# Patient Record
Sex: Male | Born: 1965 | Race: Black or African American | Hispanic: No | Marital: Single | Smoking: Former smoker
Health system: Southern US, Community
[De-identification: ages and names within clinical notes are randomized; demographics above are authoritative.]

## PROBLEM LIST (undated history)

## (undated) DIAGNOSIS — N529 Male erectile dysfunction, unspecified: Secondary | ICD-10-CM

## (undated) DIAGNOSIS — E7800 Pure hypercholesterolemia, unspecified: Secondary | ICD-10-CM

## (undated) DIAGNOSIS — Z9889 Other specified postprocedural states: Secondary | ICD-10-CM

## (undated) DIAGNOSIS — Z923 Personal history of irradiation: Secondary | ICD-10-CM

---

## 2000-07-24 ENCOUNTER — Ambulatory Visit (HOSPITAL_BASED_OUTPATIENT_CLINIC_OR_DEPARTMENT_OTHER): Admission: RE | Admit: 2000-07-24 | Discharge: 2000-07-24 | Payer: Self-pay | Admitting: Orthopedic Surgery

## 2004-09-03 ENCOUNTER — Emergency Department (HOSPITAL_COMMUNITY): Admission: EM | Admit: 2004-09-03 | Discharge: 2004-09-04 | Payer: Self-pay | Admitting: Emergency Medicine

## 2004-09-10 ENCOUNTER — Emergency Department (HOSPITAL_COMMUNITY): Admission: EM | Admit: 2004-09-10 | Discharge: 2004-09-10 | Payer: Self-pay | Admitting: Emergency Medicine

## 2006-06-12 DIAGNOSIS — Z9889 Other specified postprocedural states: Secondary | ICD-10-CM

## 2006-06-12 HISTORY — DX: Other specified postprocedural states: Z98.890

## 2006-06-12 HISTORY — PX: OTHER SURGICAL HISTORY: SHX169

## 2008-07-26 ENCOUNTER — Ambulatory Visit (HOSPITAL_COMMUNITY): Admission: EM | Admit: 2008-07-26 | Discharge: 2008-07-26 | Payer: Self-pay | Admitting: Emergency Medicine

## 2008-07-26 ENCOUNTER — Encounter: Payer: Self-pay | Admitting: Emergency Medicine

## 2008-09-04 ENCOUNTER — Ambulatory Visit (HOSPITAL_COMMUNITY): Admission: RE | Admit: 2008-09-04 | Discharge: 2008-09-04 | Payer: Self-pay | Admitting: Plastic Surgery

## 2010-01-09 ENCOUNTER — Emergency Department (HOSPITAL_COMMUNITY): Admission: EM | Admit: 2010-01-09 | Discharge: 2010-01-09 | Payer: Self-pay | Admitting: Emergency Medicine

## 2010-01-13 ENCOUNTER — Emergency Department (HOSPITAL_COMMUNITY): Admission: EM | Admit: 2010-01-13 | Discharge: 2010-01-13 | Payer: Self-pay | Admitting: Emergency Medicine

## 2010-09-27 LAB — BASIC METABOLIC PANEL
Chloride: 105 mEq/L (ref 96–112)
Creatinine, Ser: 0.64 mg/dL (ref 0.4–1.5)
Glucose, Bld: 116 mg/dL — ABNORMAL HIGH (ref 70–99)
Potassium: 3.9 mEq/L (ref 3.5–5.1)
Sodium: 138 mEq/L (ref 135–145)

## 2010-09-27 LAB — DIFFERENTIAL
Basophils Absolute: 0 10*3/uL (ref 0.0–0.1)
Lymphocytes Relative: 29 % (ref 12–46)
Monocytes Absolute: 0.2 10*3/uL (ref 0.1–1.0)
Neutro Abs: 4.1 10*3/uL (ref 1.7–7.7)

## 2010-09-27 LAB — CBC
MCV: 88 fL (ref 78.0–100.0)
WBC: 6.1 10*3/uL (ref 4.0–10.5)

## 2010-09-27 LAB — APTT: aPTT: 27 seconds (ref 24–37)

## 2010-09-27 LAB — PROTIME-INR: Prothrombin Time: 13 seconds (ref 11.6–15.2)

## 2010-10-25 NOTE — Op Note (Signed)
George Norris, George Norris              ACCOUNT NO.:  [id number]   MEDICAL RECORD NO.:  [id number]          PATIENT TYPE:  INP   LOCATION:  2550                         FACILITY:  [facility]   PHYSICIAN:  Loreta Ave, MD DATE OF BIRTH:  26-Aug-1965   DATE OF PROCEDURE:  DATE OF DISCHARGE:  07/26/2008                               OPERATIVE REPORT   PREOPERATIVE DIAGNOSES:  Right long finger amputation and right long  finger distal phalanx fracture.   POSTOPERATIVE DIAGNOSES:  Right long finger amputation and right long  finger distal phalanx fracture.   PROCEDURE PERFORMED:  1. ORIF of right long finger distal phalanx fracture.  2. Completion amputation of right long finger distal phalanx.   SURGEON:  Loreta Ave, MD   ANESTHESIA:  General.   TOURNIQUET TIME:  53 minutes at 250 mmHg.   ESTIMATED BLOOD LOSS:  Zero.   COMPLICATIONS:  None.   IV FLUIDS:  700 mL of crystalloid.   URINE OUTPUT:  Not recorded.   DRAINS:  None.   CLINICAL INDICATION:  George Norris is a 45 year old right-hand  dominant male who crushed the tip of his right long finger in a door  early this morning.  He presented to the [facility] and  was transferred to [facility] for definitive care.  Examination  of the x-rays revealed a longitudinal fracture of the distal phalanx of  the right long finger that was intra-articular.  He also had an  amputation of the tip of his distal phalanx with an open wound making  this an open fracture.   After a discussion of the risks of surgery which include but are not  limited bleeding, infection, damage to the nearby structures, nonunion,  malunion, ongoing tip sensitivity, the need for future surgery, [name]  understood these risks and desired to proceed.   DESCRIPTION OF THE OPERATION:  The patient was brought to the operating  room and placed in the supine position on the operating room table.  After smooth and routine induction  of general anesthesia with LMA, the  patient's right upper extremity was prepped and draped into a sterile  field.  A well-padded pneumatic tourniquet was placed on the arm.  The  right upper extremity was exsanguinated with an Esmarch bandage, and the  tourniquet was inflated to 250 mmHg.  The procedure began by examining  the wound at the tip of the right long finger which revealed exposed  bone.  There was no subungual hematoma.  First, small bony fragments  were removed with rongeurs.  The tip of the finger was smoothed with a  rasp.  Next, the mid axial incision was made along the radial aspect of  the long finger distal phalanx.  Dissection proceeded down to the bone,  and the bipolar electrocautery was used for hemostasis.  Next, the  periosteum was elevated both dorsally and volarly for 2 mm on either  side of the mid axial line.  Next, the fracture was examined and found  to be longitudinal with roughly equal portions on the radial and ulnar  halves of the  distal phalanx.  Next, the fracture was reduced with a  bone clamp and fluoroscopy revealed excellent alignment.  Next, a 1.1-mm  drill was used to make a transverse hole from radial to ulnar.  This was  measured, and an 8 mm x 1.5 mm screw was used across the fracture site.  This was evaluated with fluoroscopy and found to have reached the far  cortex and to provide good alignment.  Next, the same drill was used to  place a parallel screw approximately 7 mm proximal to the first.  This  was measured and found to be 10 mm.  A 10-mm screw was placed and  evaluated radiographically.  This revealed purchase of the screw threads  on the far cortex and anatomic reduction of the proximal surface of the  distal phalanx.  Next, the wound was irrigated with normal saline, and  ragged skin edges were trimmed with tenotomy scissors.  The skin was  then closed with 4-0 interrupted chromic sutures.  A sterile dressing  was applied and a  PIP-free finger splint was incorporated into the  sterile dressing.  The patient was extubated and transported to the  recovery in stable condition.  Sponge and needle counts were reported as  correct x2.      Loreta Ave, MD  Electronically Signed     CF/MEDQ  D:  07/26/2008  T:  07/27/2008  Job:  [id number]

## 2010-10-28 NOTE — Op Note (Signed)
[place]. [facility]  Patient:    George Norris, George Norris                       MRN: [id number] Proc. Date: 07/24/00 Adm. Date:  [id number] Attending:  Ronne Binning                           Operative Report  PREOPERATIVE DIAGNOSIS:  Fracture proximal phalanx, intraarticular, left ring finger.  POSTOPERATIVE DIAGNOSIS:  Fracture proximal phalanx, intraarticular, left ring finger.  OPERATION:  Open reduction/internal fixation proximal phalanx fracture, left ring finger.  SURGEON:  Nicki Reaper, M.D.  ASSISTANT:  Joaquin Courts, R.N.  ANESTHESIA:  Axillary block.  ANESTHESIOLOGIST:  Halford Decamp, M.D.  HISTORY:  The patient is a 45 year old male with a comminuted intraarticular fracture with compression of the fracture fragments of his left ring finger proximal phalanx.  PROCEDURE:  The patient was brought to the operating room, where an axillary block was carried out without difficulty.  He was prepped and draped using Betadine scrub and solution with the left arm free.  The limb was exsanguinated with an Esmarch bandage.  Tourniquet placed high on the arm, was inflated to 250 mmHg.  A curvilinear incision was made over the phalangeal joint.  This was carried down through subcutaneous tissue.  Bleeders were electrocauterized and an incision was made in the sagittal fibers on the radial aspect leaving the most proximal ones intact.  Joint was opened after an incision of the periosteum.  The fracture fragments were immediately apparent.  Periosteum was elevated.  The depressed fracture fragment was then teased free, granulation tissue and clot removed.  This was reduced visually until the articular surface was smooth.  The second fragment was then reduced over this.  This was then clamped with a bone clamp.  X-ray was taken and revealed confirmation of positioning.  Two 1.5 mm screws were then inserted from the ulnar side after an incision in the ulnar  sagittal fibers.  These were drilled, measured.  These were found to be 16 mm.  These were then inserted.  Position was confirmed with A/P, lateral and oblique x-rays.  The clamp was removed.  The two second screw was inserted and x-rays confirmed positioning of the screws with anatomic reduction of the fracture fragment. This was visually confirmed.  The wound was copiously irrigated with saline. The periosteum closed with a running 4-0 chromic suture.  The extensor tendon was repaired with figure-of-eight sutures 5-0 [place] sutures radially and ulnarly.  The skin was repaired with interrupted 5-0 nylon sutures.  Sterile compressive dressing and splint was applied.  The patient tolerated the procedure well and was taken to the recovery room for observation in satisfactory condition.  He was discharged home to return to the [facility] in one week on Vicodin and Keflex. DD:  07/24/00 TD:  07/24/00 Job: [id number]

## 2012-12-02 ENCOUNTER — Emergency Department (HOSPITAL_COMMUNITY): Payer: Self-pay

## 2012-12-02 ENCOUNTER — Encounter (HOSPITAL_COMMUNITY): Payer: Self-pay | Admitting: *Deleted

## 2012-12-02 ENCOUNTER — Emergency Department (HOSPITAL_COMMUNITY)
Admission: EM | Admit: 2012-12-02 | Discharge: 2012-12-02 | Disposition: A | Discharge status: Home / Self Care | Payer: Self-pay | Attending: Emergency Medicine | Admitting: Emergency Medicine

## 2012-12-02 DIAGNOSIS — Z79899 Other long term (current) drug therapy: Secondary | ICD-10-CM | POA: Insufficient documentation

## 2012-12-02 DIAGNOSIS — Y9389 Activity, other specified: Secondary | ICD-10-CM | POA: Insufficient documentation

## 2012-12-02 DIAGNOSIS — W230XXA Caught, crushed, jammed, or pinched between moving objects, initial encounter: Secondary | ICD-10-CM | POA: Insufficient documentation

## 2012-12-02 DIAGNOSIS — F17200 Nicotine dependence, unspecified, uncomplicated: Secondary | ICD-10-CM | POA: Insufficient documentation

## 2012-12-02 DIAGNOSIS — S62639A Displaced fracture of distal phalanx of unspecified finger, initial encounter for closed fracture: Secondary | ICD-10-CM | POA: Insufficient documentation

## 2012-12-02 DIAGNOSIS — Y929 Unspecified place or not applicable: Secondary | ICD-10-CM | POA: Insufficient documentation

## 2012-12-02 MED ORDER — HYDROCODONE-ACETAMINOPHEN 5-325 MG PO TABS: 1.0000 | ORAL_TABLET | ORAL | Status: DC | PRN

## 2012-12-02 MED ORDER — MELOXICAM 7.5 MG PO TABS: ORAL_TABLET | ORAL | Status: DC

## 2012-12-02 NOTE — ED Notes (Signed)
Closed RRf in car door last week , subungal hematoma and swelling present.

## 2012-12-02 NOTE — ED Provider Notes (Signed)
History    CSN: [id number] Arrival date & time 12/02/12  1402  First MD Initiated Contact with Patient 12/02/12 1511     Chief Complaint  Patient presents with  . Hand Injury   (Consider location/radiation/quality/duration/timing/severity/associated sxs/prior Treatment) Patient is a 47 y.o. male presenting with hand injury. The history is provided by the patient.  Hand Injury Location:  Hand Time since incident:  10 days Injury: yes   Mechanism of injury: crush   Crush injury:    Mechanism:  Door Hand location:  R hand Pain details:    Quality:  Aching   Radiates to:  Does not radiate   Severity:  No pain   Onset quality:  Sudden   Timing:  Constant   Progression:  Worsening Chronicity:  New Handedness:  Right-handed Dislocation: no   Prior injury to area:  No Relieved by:  Nothing Worsened by:  Nothing tried Associated symptoms: swelling   Associated symptoms: no back pain and no neck pain    History reviewed. No pertinent past medical history. History reviewed. No pertinent past surgical history. History reviewed. No pertinent family history. History  Substance Use Topics  . Smoking status: Current Every Day Smoker  . Smokeless tobacco: Not on file  . Alcohol Use: Yes    Review of Systems  Constitutional: Negative for activity change.       All ROS Neg except as noted in HPI  HENT: Negative for nosebleeds and neck pain.   Eyes: Negative for photophobia and discharge.  Respiratory: Negative for cough, shortness of breath and wheezing.   Cardiovascular: Negative for chest pain and palpitations.  Gastrointestinal: Negative for abdominal pain and blood in stool.  Genitourinary: Negative for dysuria, frequency and hematuria.  Musculoskeletal: Negative for back pain and arthralgias.  Skin: Negative.   Neurological: Negative for dizziness, seizures and speech difficulty.  Psychiatric/Behavioral: Negative for hallucinations and confusion.    Allergies    Review of patient's allergies indicates no known allergies.  Home Medications   Current Outpatient Rx  Name  Route  Sig  Dispense  Refill  . HYDROcodone-acetaminophen (NORCO/VICODIN) 5-325 MG per tablet   Oral   Take 1 tablet by mouth every 4 (four) hours as needed for pain.   15 tablet   0   . meloxicam (MOBIC) 7.5 MG tablet      1 po bid with food   12 tablet   0    BP 130/82  Pulse 85  Temp(Src) 99.3 F (37.4 C) (Oral)  Resp 18  Ht 6' (1.829 m)  Wt 170 lb (77.111 kg)  BMI 23.05 kg/m2  SpO2 100% Physical Exam  Nursing note and vitals reviewed. Constitutional: He is oriented to person, place, and time. He appears well-developed and well-nourished.  Non-toxic appearance.  HENT:  Head: Normocephalic.  Right Ear: Tympanic membrane and external ear normal.  Left Ear: Tympanic membrane and external ear normal.  Eyes: EOM and lids are normal. Pupils are equal, round, and reactive to light.  Neck: Normal range of motion. Neck supple. Carotid bruit is not present.  Cardiovascular: Normal rate, regular rhythm, normal heart sounds, intact distal pulses and normal pulses.   Pulmonary/Chest: Breath sounds normal. No respiratory distress.  Abdominal: Soft. Bowel sounds are normal. There is no tenderness. There is no guarding.  Musculoskeletal: Normal range of motion.       Hands: Lymphadenopathy:       Head (right side): No submandibular adenopathy present.  Head (left side): No submandibular adenopathy present.    He has no cervical adenopathy.  Neurological: He is alert and oriented to person, place, and time. He has normal strength. No cranial nerve deficit or sensory deficit.  Skin: Skin is warm and dry.  Psychiatric: He has a normal mood and affect. His speech is normal.    ED Course  Procedures (including critical care time) Labs Reviewed - No data to display Dg Finger Ring Right  12/02/2012   *RADIOLOGY REPORT*  Clinical Data: Ring caught in a truck door 1  week ago.  Pain, swelling, discoloration of the fingernail.  RIGHT RING FINGER 2+V  Comparison: 09/04/2008  Findings: Three views are performed, showing comminuted and minimally displaced tuft fracture of the distal phalanx.  There is associated significant soft tissue swelling.  The proximal and middle phalanges are normal in appearance.  IMPRESSION:  1.  Comminuted and minimally displaced distal right tuft fracture. 2.  Significant soft tissue swelling.   Original Report Authenticated By: Norva Pavlov, M.D.   1. Closed fracture of tuft of distal phalanx of finger, initial encounter     MDM  I have reviewed nursing notes, vital signs, and all appropriate lab and imaging results for this patient.  Patient mesh finger in a car door approximately 10 days ago. He continues to have pain and some swelling and presents to the emergency department for evaluation. The x-ray of the finger finger reveals a comminuted and minimally displaced distal tuft fracture. It is of note that the patient had a subungual hematoma present, it was deemed however inappropriate to attempt to drain it 10 days after back. The plan at this time is for the patient to be in a finger splint. He will followup with the orthopedic physician for additional evaluation. Prescription for Mobic and Norco given to the patient, patient referred to orthopedics.          Kathie Dike, PA-C 12/02/12 1644

## 2012-12-04 NOTE — ED Provider Notes (Signed)
Medical screening examination/treatment/procedure(s) were performed by non-physician practitioner and as supervising physician I was immediately available for consultation/collaboration.     Christopher J. Pollina, MD 12/04/12 1505 

## 2013-08-18 ENCOUNTER — Ambulatory Visit: Payer: Self-pay | Admitting: Family Medicine

## 2015-02-09 ENCOUNTER — Encounter (INDEPENDENT_AMBULATORY_CARE_PROVIDER_SITE_OTHER): Payer: Self-pay | Admitting: *Deleted

## 2015-08-01 ENCOUNTER — Encounter (HOSPITAL_COMMUNITY): Payer: Self-pay

## 2015-08-01 ENCOUNTER — Emergency Department (HOSPITAL_COMMUNITY)
Admission: EM | Admit: 2015-08-01 | Discharge: 2015-08-01 | Disposition: A | Discharge status: Home / Self Care | Payer: BLUE CROSS/BLUE SHIELD | Attending: Emergency Medicine | Admitting: Emergency Medicine

## 2015-08-01 DIAGNOSIS — Z79899 Other long term (current) drug therapy: Secondary | ICD-10-CM | POA: Diagnosis not present

## 2015-08-01 DIAGNOSIS — J029 Acute pharyngitis, unspecified: Secondary | ICD-10-CM | POA: Diagnosis present

## 2015-08-01 DIAGNOSIS — F17210 Nicotine dependence, cigarettes, uncomplicated: Secondary | ICD-10-CM | POA: Insufficient documentation

## 2015-08-01 DIAGNOSIS — J36 Peritonsillar abscess: Secondary | ICD-10-CM | POA: Insufficient documentation

## 2015-08-01 MED ORDER — CLINDAMYCIN HCL 150 MG PO CAPS: 150.0000 mg | ORAL_CAPSULE | Freq: Four times a day (QID) | ORAL | Status: DC

## 2015-08-01 MED ORDER — CLINDAMYCIN HCL 150 MG PO CAPS
300.0000 mg | ORAL_CAPSULE | Freq: Once | ORAL | Status: AC
  Administered 2015-08-01: 300 mg via ORAL
  Filled 2015-08-01: qty 2

## 2015-08-01 MED ORDER — BUTAMBEN-TETRACAINE-BENZOCAINE 2-2-14 % EX AERO
1.0000 | INHALATION_SPRAY | Freq: Once | CUTANEOUS | Status: AC
  Administered 2015-08-01: 1 via TOPICAL
  Filled 2015-08-01: qty 20

## 2015-08-01 NOTE — Discharge Instructions (Signed)
Peritonsillar Abscess °A peritonsillar abscess is a collection of yellowish-white fluid (pus) in the back of the throat behind the tonsils. It usually occurs when an infection of the throat or tonsils (tonsillitis) spreads into the tissues around the tonsils. °CAUSES °The infection that leads to a peritonsillar abscess is usually caused by streptococcal bacteria.  °SIGNS AND SYMPTOMS °· Sore throat, often with pain on just one side. °· Swelling and tenderness of the glands (lymph nodes) in the neck. °· Difficulty swallowing. °· Difficulty opening your mouth. °· Fever. °· Chills. °· Drooling because of difficulty swallowing saliva. °· Headache. °· Changes in your voice. °· Bad breath. °DIAGNOSIS °Your health care provider will take your medical history and do a physical exam. Imaging tests may be done, such as an ultrasound or CT scan. A sample of pus may be removed from the abscess using a needle (needle aspiration) or by swabbing the back of your throat. This sample will be sent to a lab for testing. °TREATMENT °Treatment usually involves draining the pus from the abscess. This may be done through needle aspiration or by making an incision in the abscess. You will also likely need to take antibiotic medicine. °HOME CARE INSTRUCTIONS °· Rest as much as possible and get plenty of sleep. °· Take medicines only as directed by your health care provider. °· If you were prescribed an antibiotic medicine, finish it all even if you start to feel better. °· If your abscess was drained by your health care provider, gargle with a mixture of salt and warm water: °¨ Mix 1 tsp of salt in 8 oz of warm water. °¨ Gargle with this mixture four times per day or as needed for comfort. °¨ Do not swallow this mixture. °· Drink plenty of fluids. °· While your throat is sore, eat soft or liquid foods, such as frozen ice pops and ice cream. °· Keep all follow-up visits as directed by your health care provider. This is important. °SEEK  MEDICAL CARE IF: °· You have increased pain, swelling, redness, or drainage in your throat. °· You develop a headache, a lack of energy (lethargy), or generalized feelings of illness. °· You have a fever. °· You feel dizzy. °· You have difficulty swallowing or eating. °· You show signs of becoming dehydrated, such as: °¨ Light-headedness when standing. °¨ Decreased urine output. °¨ A fast heart rate. °¨ Dry mouth. °SEEK IMMEDIATE MEDICAL CARE IF:  °· You have difficulty talking or breathing, or you find it easier to breathe when you lean forward. °· You are coughing up blood or vomiting blood. °· You have severe throat pain that is not helped by medicines. °· You start to drool. °  °This information is not intended to replace advice given to you by your health care provider. Make sure you discuss any questions you have with your health care provider. °  °Document Released: 05/29/2005 Document Revised: 06/19/2014 Document Reviewed: 01/12/2014 °Elsevier Interactive Patient Education ©2016 [employer] Inc[employer] ° °

## 2015-08-01 NOTE — ED Notes (Signed)
Suction sit up at bedside with yankauer and verified that it is working properly

## 2015-08-01 NOTE — ED Provider Notes (Signed)
CSN: [id number]     Arrival date & time 08/01/15  [id number] History  By signing my name below, I, George Norris, attest that this documentation has been prepared under the direction and in the presence of Evalee Jefferson, PA-C. Electronically Signed: Hansel Norris, ED Scribe. 08/01/2015. 11:14 AM.      Chief Complaint  Patient presents with  . Sore Throat   The history is provided by the patient. No language interpreter was used.   HPI Comments: George Norris is a 50 y.o. male otherwise healthy who presents to the Emergency Department complaining of moderate sore throat onset 4 days ago with associated intermittent fever (Tmax 102 4 days ago), dry cough, generalized myalgias. Pt states that his pain is worsened with swallowing. He states he is able to tolerate fluids without difficulty, but is not able to tolerate foods. He notes he has taken Theraflu (last dose this morning) with no relief of symptoms.  Per wife, his voice is more coarse than normal. 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 Pt is a current smoker. NKDA. He denies congestion.    History reviewed. No pertinent past medical history. History reviewed. No pertinent past surgical history. No family history on file. Social History  Substance Use Topics  . Smoking status: Current Every Day Smoker  . Smokeless tobacco: None  . Alcohol Use: Yes     Comment: occ    Review of Systems  Constitutional: Positive for fever.  HENT: Positive for sore throat. Negative for congestion.   Respiratory: Positive for cough.   Musculoskeletal: Positive for myalgias.   Allergies  Review of patient's allergies indicates no known allergies.  Home Medications   Prior to Admission medications   Medication Sig Start Date End Date Taking? Authorizing Provider  clindamycin (CLEOCIN) 150 MG capsule Take 1 capsule (150 mg total) by mouth every 6 (six) hours. 08/01/15   Evalee Jefferson, PA-C  HYDROcodone-acetaminophen  (NORCO/VICODIN) 5-325 MG per tablet Take 1 tablet by mouth every 4 (four) hours as needed for pain. 12/02/12   Lily Kocher, PA-C  meloxicam (MOBIC) 7.5 MG tablet 1 po bid with food 12/02/12   Lily Kocher, PA-C   BP 124/104 mmHg  Pulse 112  Temp(Src) 98.9 F (37.2 C) (Oral)  Resp 18  Ht 6' (1.829 m)  Wt 89.812 kg  BMI 26.85 kg/m2  SpO2 98% Physical Exam  Constitutional: He is oriented to person, place, and time. He appears well-developed and well-nourished.  HENT:  Head: Normocephalic and atraumatic.  Mouth/Throat: Tonsillar abscesses present.  Left peritonsillar abscess. There is some left tonsillar adenopathy.   Eyes: Conjunctivae are normal.  Neck: Normal range of motion. Neck supple.  Cardiovascular: Normal rate.   Pulmonary/Chest: Effort normal. No stridor. No respiratory distress. He has no wheezes.  Musculoskeletal: Normal range of motion.  Neurological: He is alert and oriented to person, place, and time.  Skin: Skin is warm and dry.  Psychiatric: He has a normal mood and affect. His behavior is normal.  Nursing note and vitals reviewed.   ED Course  Procedures (including critical care time) DIAGNOSTIC STUDIES: Oxygen Saturation is 98% on RA, normal by my interpretation.    COORDINATION OF CARE: 11:11 AM Discussed treatment plan with pt at bedside which includes I&D, PO antibiotics, ENT follow up and pt agreed to plan.  INCISION AND DRAINAGE PROCEDURE NOTE: Patient identification was confirmed and verbal consent was obtained. This procedure was performed by Evalee Jefferson,  PA-C at 12:40 PM with Dr. Sabra Heck at bedside. Site: left tonsil  Sterile procedures observed Needle size: 21 Anesthetic used (type and amt): cetacaine Blade size: na Drainage: none Complexity: simple Packing used none Site anesthetized, needle aspiration performed with no purulent dc.  Pt tolerated procedure well without complications.  Instructions for care discussed verbally and pt provided  with additional written instructions for homecare and f/u.   MDM   Final diagnoses:  Peritonsillar abscess    Pt with sore throat, dry cough, intermittent fevers x4 days. On exam, there was a left tonsillar abscess with left tonsillar adenopathy. Needle aspiration was performed in the ED. Wound care discussed with pt. Pt will be sent home with clindamycin. Conservative home therapies discussed and recommended. Pt advised to follow up here for a recheck for any worsened sx. Pt appears safe for discharge at this time. Return precautions discussed and outlined in discharge paperwork. Pt is agreeable to plan.   I personally performed the services described in this documentation, which was scribed in my presence. The recorded information has been reviewed and is accurate.   Evalee Jefferson, PA-C 08/01/15 1243  Noemi Chapel, MD 08/01/15 [phone]

## 2015-08-01 NOTE — ED Notes (Signed)
Pt c/o sore throat, nonproductive cough, congestion, and back pain x 5 days.  Reports the worst pain is in the left side of his throat.  Reports unable to  Eat or drink.

## 2016-10-24 HISTORY — PX: OTHER SURGICAL HISTORY: SHX169

## 2016-10-24 HISTORY — PX: MAXILLECTOMY: SUR858

## 2016-11-08 ENCOUNTER — Encounter: Payer: Self-pay | Admitting: Radiation Oncology

## 2016-11-10 ENCOUNTER — Encounter: Payer: Self-pay | Admitting: *Deleted

## 2016-11-10 NOTE — Progress Notes (Signed)
Oncology Nurse Navigator Documentation  Placed introductory call to new referral patient George Norris.  LVMM requesting return call.  Gayleen Orem, RN, BSN, [place] [facility] Nurse [place] at [place] [phone]

## 2016-11-13 ENCOUNTER — Encounter: Payer: Self-pay | Admitting: Radiation Oncology

## 2016-11-13 ENCOUNTER — Telehealth: Payer: Self-pay | Admitting: *Deleted

## 2016-11-13 NOTE — Progress Notes (Signed)
Head and Neck Cancer Location of Tumor / Histology:  . 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 They noticed an abnomality to his gums and he was referred to an oral surgeon.   Biopsies of  revealed:  Squamous cell carcinoma of hard palate  Nutrition Status Yes No Comments  Weight changes? [x]  []  He has lost weight since surgery. He has lost 5-6 lbs in the last week.   Swallowing concerns? [x]  []  He is only able to eat softer foods. He is drinking 2 ensure daily.   PEG? []  [x]     Referrals Yes No Comments  Social Work? []  [x]    Dentistry? [x]  []  Dr. Elzie Rings is following  Swallowing therapy? []  [x]    Nutrition? []  [x]    Med/Onc? []  [x]     Safety Issues Yes No Comments  Prior radiation? []  [x]    Pacemaker/ICD? []  [x]    Possible current pregnancy? []  [x]    Is the patient on methotrexate? []  [x]     Tobacco/Marijuana/Snuff/ETOH use: He quit smoking 10/24/16 before his surgery. He has not had any alcohol since surgery.   Past/Anticipated interventions by otolaryngology, if any:  10/24/16 Dr. Conley Canal Procedures/Surgeries performed during hospitalization:  MAXILLECTOMY NECK DISSECTION MODIFIED RADICAL <6HRS NASAL ENDOSCOPY    Past/Anticipated interventions by medical oncology, if any:  N/A   Current Complaints / other details:   He has an appointment with Dr. Elzie Rings to have packing removed.   BP 100/71   Pulse (!) 102   Temp 98.2 F (36.8 C)   Ht 6' (1.829 m)   Wt 179 lb 3.2 oz (81.3 kg)   SpO2 99% Comment: room air  BMI 24.30 kg/m    Wt Readings from Last 3 Encounters:  11/14/16 179 lb 3.2 oz (81.3 kg)  08/01/15 198 lb (89.8 kg)  12/02/12 170 lb (77.1 kg)

## 2016-11-13 NOTE — Telephone Encounter (Signed)
Oncology Nurse Navigator Documentation  In follow-up to my 6/1 VMM, called patient to confirm his understanding of tomorrow's 10:30 Nurse Eval and 11:00 appt with Dr. Isidore Moos, understanding of [facility] location, registration process.  Unable to reach him, LVMM.  Called Emergency Contact, no answer, unable to leave VMM.  Gayleen Orem, RN, BSN, [place] Nurse [place] at [place] [phone]

## 2016-11-14 ENCOUNTER — Other Ambulatory Visit: Payer: Self-pay | Admitting: Radiation Oncology

## 2016-11-14 ENCOUNTER — Ambulatory Visit
Admission: RE | Admit: 2016-11-14 | Discharge: 2016-11-14 | Disposition: A | Discharge status: Home / Self Care | Payer: BLUE CROSS/BLUE SHIELD | Source: Ambulatory Visit | Attending: Radiation Oncology | Admitting: Radiation Oncology

## 2016-11-14 ENCOUNTER — Encounter: Payer: Self-pay | Admitting: Radiation Oncology

## 2016-11-14 ENCOUNTER — Encounter: Payer: Self-pay | Admitting: *Deleted

## 2016-11-14 DIAGNOSIS — N529 Male erectile dysfunction, unspecified: Secondary | ICD-10-CM | POA: Insufficient documentation

## 2016-11-14 DIAGNOSIS — C050 Malignant neoplasm of hard palate: Secondary | ICD-10-CM

## 2016-11-14 DIAGNOSIS — E7800 Pure hypercholesterolemia, unspecified: Secondary | ICD-10-CM | POA: Diagnosis not present

## 2016-11-14 DIAGNOSIS — R200 Anesthesia of skin: Secondary | ICD-10-CM | POA: Insufficient documentation

## 2016-11-14 DIAGNOSIS — Z87891 Personal history of nicotine dependence: Secondary | ICD-10-CM | POA: Insufficient documentation

## 2016-11-14 DIAGNOSIS — Z510 Encounter for antineoplastic radiation therapy: Secondary | ICD-10-CM | POA: Insufficient documentation

## 2016-11-14 DIAGNOSIS — Z79899 Other long term (current) drug therapy: Secondary | ICD-10-CM | POA: Diagnosis not present

## 2016-11-14 DIAGNOSIS — Z8249 Family history of ischemic heart disease and other diseases of the circulatory system: Secondary | ICD-10-CM | POA: Insufficient documentation

## 2016-11-14 HISTORY — DX: Other specified postprocedural states: Z98.890

## 2016-11-14 HISTORY — DX: Pure hypercholesterolemia, unspecified: E78.00

## 2016-11-14 HISTORY — DX: Male erectile dysfunction, unspecified: N52.9

## 2016-11-14 NOTE — Progress Notes (Signed)
Radiation Oncology         [phone]) [phone] ________________________________  Initial outpatient Consultation  Name: George Norris MRN: [id number]  Date: 11/14/2016  DOB: 1965-12-23  FB:PZWCHEN, [name], MD  Fredricka Bonine, *   REFERRING PHYSICIAN: Fredricka Bonine, *  DIAGNOSIS:    ICD-10-CM   1. 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 male who initially presented to his dentist with a loose tooth, swelling, and soreness to the gums. At this time there was an abnormality noted to his gums, and he was referred to an oral surgeon.  Biopsy  on 09/13/16 of the palate, this reveled well differentiated squamous cell carcinoma with atleast superficial invasion. p16 negative.  He underwent imaging on 09/26/16 in the form of CT of the neck and chest. CT of the neck revealed increased soft tissue thickening on the left maxilla involving the hard and soft palate with osseous destruction of the left maxilla. There was extension into the left molar trigone, pterygopalatine fossa, and palatine foramen. There was fullness of the cavernous sinus, this was symmetric, and of uncertain significance. Enlarged internal chain jugular lymph node measuring appromimately 2.3 cm at level 2. There were enlarged left submandibular lymph nodes at level 1B. CT of the chest was negative for metastatic disease. MRI of the face and sinuses with and without contrast on 10/04/16 revealed grossly similar size and extent of invasive enhancing tumor compared to his CT. There is no evidence of intracranial perineural tumor, including no evidence of tumor in Meckel's cave or cavernous sinus.  Dr. Conley Canal on 10/24/16 performed resection in the form of maxillectomy and modified  radical neck dissection for the patient's hard palate lesion, left laterality. The tumor had a depth of invasion of 7 mm and it measured 2.9 x 2.7 x 1.2 cm. Grade 1-2 squamous cell carcinoma, with margins negative by less than 1 mm. The closest margin was superior. 38 lymph nodes removed and all were negative. No LVSI, but there was perineural invasion. Pathologic stage T2N0. Of note, the patient had several teeth removed at the time of surgery.  The patient presents to the clinic today to discuss the role that radiation therapy may play in the treatment of his disease. He is accompanied by his girlfriend today. We are joined today by Gayleen Orem, RN, Head and Neck Nurse Navigator.  On review of systems, the patient reports he has lost 5-6 lbs in the last week. He is only eating softer foods at this time, and drinking 2 Ensure daily. He denies difficulty swallowing, though he does note his mouth is drier than usual. He denies seizure activity. He reports some headaches at night. He reports facial numbness along the left jaw since surgery; he denies any other facial numbness or tingling. He denies chest pain or shortness of breath. He denies any bowel or bladder concerns. He denies extremity swelling or rashes. He reports some feelings of depression since his diagnosis. The patient stopped smoking on 10/24/16 before his surgery. He has not had any alcohol since surgery.  He is following with Dr. Elzie Rings in dentistry. He has an upcoming appointment to have his packing removed. He expects to get a prosthesis eventually following treatment.   PREVIOUS RADIATION THERAPY: No  PAST MEDICAL HISTORY:  has a past medical history of Erectile dysfunction; History of hand surgery (2008); and Hypercholesteremia.    PAST SURGICAL HISTORY: Past Surgical History:  Procedure Laterality Date  . history of hand surgery Right 2008   history of hand/ tendon surgery  . left neck dissection  10/24/2016  . MAXILLECTOMY Left  10/24/2016   Left infrastructure maxillectomy, placement of maxillary prosthesis    FAMILY HISTORY: family history includes Hypertension in his brother.  SOCIAL HISTORY:  reports that he quit smoking about 3 weeks ago. He has a 25.00 pack-year smoking history. He has never used smokeless tobacco. He reports that he drinks alcohol. He reports that he does not use drugs. The patient stopped smoking on 10/24/16 before his surgery. He has not had any alcohol since surgery. The patient lives in [place]. The patient currently works with a [employer] in [place]. He enjoys watching football on TV.   ALLERGIES: Patient has no known allergies.  MEDICATIONS:  Current Outpatient Prescriptions  Medication Sig Dispense Refill  . celecoxib (CELEBREX) 200 MG capsule Take 200 mg by mouth.    . oxyCODONE (OXY IR/ROXICODONE) 5 MG immediate release tablet Take 5 mg by mouth.    . sodium chloride (OCEAN) 0.65 % nasal spray Place into the nose.    Marland Kitchen HYDROcodone-acetaminophen (NORCO/VICODIN) 5-325 MG per tablet Take 1 tablet by mouth every 4 (four) hours as needed for pain. (Patient not taking: Reported on 11/14/2016) 15 tablet 0  . meloxicam (MOBIC) 7.5 MG tablet 1 po bid with food (Patient not taking: Reported on 11/14/2016) 12 tablet 0   No current facility-administered medications for this encounter.     REVIEW OF SYSTEMS:  A 10+ POINT REVIEW OF SYSTEMS WAS OBTAINED including neurology, dermatology, psychiatry, cardiac, respiratory, lymph, extremities, GI, GU, musculoskeletal, constitutional, HEENT. All pertinent positives are noted in the HPI. All others are negative.   PHYSICAL EXAM:  height is 6' (1.829 m) and weight is 179 lb 3.2 oz (81.3 kg). His temperature is 98.2 F (36.8 C). His blood pressure is 100/71 and his pulse is 102 (abnormal). His oxygen saturation is 99%.  General: Alert and oriented, in no acute distress. HEENT: Prosthesis over the palate not removed due to specific instructions from  oral surgeon; this has replaced his upper teeth from the left front tooth back to all of the left molars. Portion of oropharynx visualized did not have any lesions. No oral thrush. Neck: Neck is notable for left neck healing from neck dissection. He still has some Neosporin along the scar. No palpable adenopathy in the right neck. I did not palpate the left neck deeply. Heart: Regular in rate and rhythm with no murmurs. Chest: Clear to auscultation bilaterally. Abdomen: Soft, non tender, non distended. Extremities: No edema. Lymphatics: see Neck Exam Skin: No concerning lesions. Musculoskeletal: symmetric strength throughout. Neurologic: No obvious focalities. Coordination is intact. Psychiatric: Judgment and insight are intact. Affect is appropriate.  ECOG = 1  LABORATORY DATA:  Lab Results  Component Value Date   WBC 6.1 07/26/2008   HGB 15.5 07/26/2008   HCT 45.3 07/26/2008   MCV 88.0 07/26/2008   PLT 270 07/26/2008   CMP     Component Value Date/Time   NA 138 07/26/2008 0703   K 3.9 07/26/2008 0703   CL 105 07/26/2008 0703   CO2 22 07/26/2008 0703   GLUCOSE 116 (H) 07/26/2008 0703   BUN 10 07/26/2008 0703   CREATININE 0.64 07/26/2008 0703  CALCIUM 8.9 07/26/2008 0703   GFRNONAA >60 07/26/2008 0703   GFRAA  07/26/2008 0703    >60        The eGFR has been calculated using the MDRD equation. This calculation has not been validated in all clinical situations. eGFR's persistently <60 mL/min signify possible Chronic Kidney Disease.    RADIOGRAPHY: as above    IMPRESSION/PLAN:  This is a delightful patient with head and neck cancer. I would recommend 6 weeks of adjuvant radiotherapy for this patient.  We discussed the potential risks, benefits, and side effects of radiotherapy. We talked in detail about acute and late effects. We discussed that some of the most bothersome acute effects may be mucositis, dysgeusia, salivary changes, skin irritation, hair loss,  dehydration, weight loss and fatigue. We talked about late effects which include but are not necessarily limited to dysphagia, hypothyroidism, nerve injury, spinal cord injury, xerostomia, trismus, and neck edema. No guarantees of treatment were given. A consent form was signed and placed in the patient's medical record. The patient is enthusiastic about proceeding with treatment. I look forward to participating in the patient's care.    Simulation (treatment planning) will take place in the near future, allowing at least 6 weeks to heal from surgery. It is preferable that the patient will not undergo treatment planning until he can remove his prosthesis. Consent form was discussed and signed today.   We also discussed that the treatment of head and neck cancer is a multidisciplinary process to maximize treatment outcomes and quality of life. For this reasons the following referrals have been or will be made:  1) Dentistry for dental evaluation, advice on reducing risk of cavities, osteoradionecrosis, or other oral issues. Followup with Dr Elzie Rings as well.  I spoke with both providers today. Dr. Elzie Rings will provide a bite block/scatter guard/tongue depressor, while Dr. Enrique Sack will address pre-RT hygiene and long term oral health counseling. Dr. Elzie Rings will make sure patient can remove temporary prosthesis for RT planning by 11-24-16 sim date.   2) Nutritionist for nutrition support during and after treatment.  3) Speech language pathology for swallowing and/or speech therapy.  4) Social work for social support.   5) Physical therapy due to risk of lymphedema in neck and deconditioning.  6) Baseline labs including TSH.    I encouraged the patient to take time off of work as needed during treatment. He is off of work now until mid July, and may extend this. Despite this, I encouraged the patient to stay lightly active to maintain his energy and physical health.  I spoke with Dr. Conley Canal about this  case. Although all of nodes were negative, Dr. Conley Canal would feel more comfortable if we treated the ipsilateral neck as it is difficult to surgically salvage the neck. Due to perineural invasion we talked about treating along the ipsilateral v2 and v3 cranial nerves to foramen  rotundum, and ovale.    Eppie Gibson, MD  This document serves as a record of services personally performed by Eppie Gibson, MD. It was created on her behalf by Maryla Morrow, a trained medical scribe. The creation of this record is based on the scribe's personal observations and the provider's statements to them. This document has been checked and approved by the attending provider.

## 2016-11-15 ENCOUNTER — Other Ambulatory Visit: Payer: Self-pay | Admitting: Radiation Oncology

## 2016-11-15 ENCOUNTER — Other Ambulatory Visit: Payer: Self-pay | Admitting: *Deleted

## 2016-11-15 ENCOUNTER — Ambulatory Visit
Admission: RE | Admit: 2016-11-15 | Payer: BLUE CROSS/BLUE SHIELD | Source: Ambulatory Visit | Admitting: Radiation Oncology

## 2016-11-15 DIAGNOSIS — R634 Abnormal weight loss: Secondary | ICD-10-CM

## 2016-11-15 DIAGNOSIS — C050 Malignant neoplasm of hard palate: Secondary | ICD-10-CM | POA: Insufficient documentation

## 2016-11-15 NOTE — Progress Notes (Signed)
Oncology Nurse Navigator Documentation  Met with Mr. Bertino during initial consult with Dr. Isidore Moos.  He was accompanied by his fiance Ramona.  1. Introduced myself as his Navigator, explained my role as a member of the Care Team.   2. Provided New Patient Information packet, discussed contents:  Contact information for physician(s), myself, other members of the Care Team.  Advance Directive information ([place] blue pamphlet with LCSW contact info); provided [facility] AD document.  Fall Prevention Patient Safety Plan  Appointment Guideline  Financial Assistance Information sheet  [place] with highlight of [place] 3. Provided introductory explanation of radiation treatment including SIM planning and purpose of Aquaplast head and shoulder mask, showed them example.   4. They voiced understanding of proposed plan for RT, unlikely need for PEG. 5. We discussed his attendance at the 6/26 [facility] [place], I provided an 0800 arrival time. 6. Provided a tour of SIM and treatment area, explained arrival procedure. 7. I encouraged them to contact me with questions/concerns as treatments/procedures begin.  They verbalized understanding of information provided.   They understand to call me with questions/needs.  Gayleen Orem, RN, BSN, [place] Neck Oncology Nurse [place] [facility] [place] [phone]

## 2016-11-20 ENCOUNTER — Other Ambulatory Visit: Payer: Self-pay | Admitting: Radiation Oncology

## 2016-11-21 ENCOUNTER — Ambulatory Visit: Payer: BLUE CROSS/BLUE SHIELD | Admitting: Physical Therapy

## 2016-11-22 ENCOUNTER — Telehealth: Payer: Self-pay | Admitting: *Deleted

## 2016-11-22 NOTE — Telephone Encounter (Signed)
Oncology Nurse Navigator Documentation  Called patient to confirm understanding of CT SIM 1100 6/15, spoke with his fiancee Ramona. She voiced understanding of appt and need to bring bite block/oral device being fitted by oral surgeon tomorrow. I explained he will need to remove prosthetic during SIM as well.  Gayleen Orem, RN, BSN, [place] Neck Oncology Nurse [place] [facility] [place] [phone]

## 2016-11-22 NOTE — Progress Notes (Signed)
Head and Neck Cancer Simulation, IMRT treatment planning note   Outpatient  Diagnosis:    ICD-10-CM   1. 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 Procedure was called off due to the oral device not fitting properly.  Will discuss with his oral surgeon and reschedule simulation.  -----------------------------------  Eppie Gibson, MD

## 2016-11-24 ENCOUNTER — Ambulatory Visit
Admission: RE | Admit: 2016-11-24 | Discharge: 2016-11-24 | Disposition: A | Discharge status: Home / Self Care | Payer: BLUE CROSS/BLUE SHIELD | Source: Ambulatory Visit | Attending: Radiation Oncology | Admitting: Radiation Oncology

## 2016-11-24 DIAGNOSIS — C050 Malignant neoplasm of hard palate: Secondary | ICD-10-CM

## 2016-11-24 DIAGNOSIS — R634 Abnormal weight loss: Secondary | ICD-10-CM

## 2016-11-24 LAB — TSH: TSH: 1.009 m(IU)/L (ref 0.320–4.118)

## 2016-11-28 ENCOUNTER — Encounter (HOSPITAL_COMMUNITY): Payer: Self-pay | Admitting: Dentistry

## 2016-11-28 ENCOUNTER — Ambulatory Visit (HOSPITAL_COMMUNITY): Payer: Self-pay | Admitting: Dentistry

## 2016-11-28 VITALS — BP 120/70 | HR 88 | Temp 98.3°F

## 2016-11-28 DIAGNOSIS — K08409 Partial loss of teeth, unspecified cause, unspecified class: Secondary | ICD-10-CM

## 2016-11-28 DIAGNOSIS — R252 Cramp and spasm: Secondary | ICD-10-CM

## 2016-11-28 DIAGNOSIS — Z01818 Encounter for other preprocedural examination: Secondary | ICD-10-CM

## 2016-11-28 DIAGNOSIS — K036 Deposits [accretions] on teeth: Secondary | ICD-10-CM

## 2016-11-28 DIAGNOSIS — K06010 Localized gingival recession, unspecified: Secondary | ICD-10-CM

## 2016-11-28 DIAGNOSIS — M264 Malocclusion, unspecified: Secondary | ICD-10-CM

## 2016-11-28 DIAGNOSIS — K0530 Chronic periodontitis, unspecified: Secondary | ICD-10-CM

## 2016-11-28 DIAGNOSIS — C059 Malignant neoplasm of palate, unspecified: Secondary | ICD-10-CM

## 2016-11-28 DIAGNOSIS — M270 Developmental disorders of jaws: Secondary | ICD-10-CM

## 2016-11-28 DIAGNOSIS — C050 Malignant neoplasm of hard palate: Secondary | ICD-10-CM

## 2016-11-28 NOTE — Progress Notes (Signed)
DENTAL CONSULTATION  Date of Consultation:  11/28/2016 Patient Name:   George Norris Date of Birth:   [birth year] Medical Record Number: [id number]  VITALS: BP 120/70 (BP Location: Left Arm)   Pulse 88   Temp 98.3 F (36.8 C) (Oral)   CHIEF COMPLAINT: Patient referred by Dr. Isidore Moos for a dental consultation.  HPI: George Norris is a 51 year old male recently diagnosed with squamous cell carcinoma of the hard palate. Patient is status post left partial maxillectomy with Dr. Conley Canal followed by placement of an obturator fabricated by Dr. Elzie Rings (oral and maxillofacial prosthodontist).  Patient with anticipated radiation therapy with Dr. Isidore Moos. Patient is now seen as part of a medically necessary preradiation therapy dental protocol examination.  The patient currently denies acute toothaches, swellings, or abscesses. Patient was last seen by his primary dentist, Dr. Tamala Julian in [facility] approximately 4-6 months ago. Patient had a cleaning and a dental etraction at that time. Patient was then referred to Dr. Lewanda Rife for a biopsy of a suspicious lesion. After cancer was diagnosed, the patient was referred to Dr. Conley Canal for evaluation for surgical treatment. Patient then had an appointment with Dr. Elzie Rings, oral and maxillofacial prosthodontist, fabricated a obturator to be placed at the time of the surgical resection. Patient has been recently seen by Dr. Elzie Rings for adjustment of the temporary surgical obturator. Patient denies having dental phobia.   PROBLEM LIST: Patient Active Problem List   Diagnosis Date Noted  . Squamous cell carcinoma of hard palate ([place]) 11/15/2016    Priority: High    PMH: Past Medical History:  Diagnosis Date  . Erectile dysfunction   . History of hand surgery 2008   right hand tendon surgery  . Hypercholesteremia     PSH: Past Surgical History:  Procedure Laterality Date  . history of hand surgery Right 2008   history of hand/ tendon surgery  .  left neck dissection  10/24/2016  . MAXILLECTOMY Left 10/24/2016   Left infrastructure maxillectomy, placement of maxillary prosthesis    ALLERGIES: No Known Allergies  MEDICATIONS: Current Outpatient Prescriptions  Medication Sig Dispense Refill  . HYDROcodone-acetaminophen (NORCO/VICODIN) 5-325 MG per tablet Take 1 tablet by mouth every 4 (four) hours as needed for pain. (Patient not taking: Reported on 11/14/2016) 15 tablet 0  . meloxicam (MOBIC) 7.5 MG tablet 1 po bid with food (Patient not taking: Reported on 11/14/2016) 12 tablet 0  . sodium chloride (OCEAN) 0.65 % nasal spray Place into the nose.     No current facility-administered medications for this visit.     LABS: Lab Results  Component Value Date   WBC 6.1 07/26/2008   HGB 15.5 07/26/2008   HCT 45.3 07/26/2008   MCV 88.0 07/26/2008   PLT 270 07/26/2008      Component Value Date/Time   NA 138 07/26/2008 0703   K 3.9 07/26/2008 0703   CL 105 07/26/2008 0703   CO2 22 07/26/2008 0703   GLUCOSE 116 (H) 07/26/2008 0703   BUN 10 07/26/2008 0703   CREATININE 0.64 07/26/2008 0703   CALCIUM 8.9 07/26/2008 0703   GFRNONAA >60 07/26/2008 0703   GFRAA  07/26/2008 0703    >60        The eGFR has been calculated using the MDRD equation. This calculation has not been validated in all clinical situations. eGFR's persistently <60 mL/min signify possible Chronic Kidney Disease.   Lab Results  Component Value Date   INR 1.0 07/26/2008   No results found for:  PTT  SOCIAL HISTORY: Social History   Social History  . Marital status: Single    Spouse name: N/A  . Number of children: N/A  . Years of education: N/A   Occupational History  . Not on file.   Social History Main Topics  . Smoking status: Former Smoker    Packs/day: 1.00    Years: 25.00    Quit date: 10/24/2016  . Smokeless tobacco: Never Used  . Alcohol use Yes     Comment: he drinks on the weekend occasionally  . Drug use: No  . Sexual activity:  Not on file   Other Topics Concern  . Not on file   Social History Narrative  . No narrative on file    FAMILY HISTORY: Family History  Problem Relation Age of Onset  . Hypertension Brother     REVIEW OF SYSTEMS: Reviewed with the patient as per History of present illness. Psych: Patient denies having dental phobia.  DENTAL HISTORY: CHIEF COMPLAINT: Patient referred by Dr. Isidore Moos for a dental consultation.  HPI: George Norris is a 51 year old male recently diagnosed with squamous cell carcinoma of the hard palate. Patient is status post left partial maxillectomy with Dr. Conley Canal followed by placement of an obturator fabricated by Dr. Elzie Rings (oral and maxillofacial prosthodontist).  Patient with anticipated radiation therapy with Dr. Isidore Moos. Patient is now seen as part of a medically necessary preradiation therapy dental protocol examination.  The patient currently denies acute toothaches, swellings, or abscesses. Patient was last seen by his primary dentist, Dr. Tamala Julian in [facility] approximately 4-6 months ago. Patient had a cleaning and a dental etraction at that time. Patient was then referred to Dr. Lewanda Rife for a biopsy of a suspicious lesion. After cancer was diagnosed, the patient was referred to Dr. Conley Canal for evaluation for surgical treatment. Patient then had an appointment with Dr. Elzie Rings, oral and maxillofacial prosthodontist, fabricated a obturator to be placed at the time of the surgical resection. Patient has been recently seen by Dr. Elzie Rings for adjustment of the temporary surgical obturator. Patient denies having dental phobia.   DENTAL EXAMINATION: GENERAL:The patient is well-developed, well-nourished male in no acute distress. HEAD AND NECK: The patient has had a left neck dissection. There is no right neck lymphadenopathy. Patient has limited opening of 10-15 mm with deviation to the left on opening. Patient is experiencing trismus symptoms.  INTRAORAL EXAM:   The patient has xerostomia. The patient has bilateral mandibular lingual tori. Patient has a surgical defect consistent with left partial maxillectomy DENTITIONON:  The patient is missing tooth numbers 9 through 16, 19, and 30. PERIODONTAL: Patient has chronic periodontitis with plaque accumulations, selective areas gingival recession and no significant tooth mobility.  DENTAL CARIES/SUBOPTIMAL RESTORATIONS:Patient has a gold crown on tooth #8 with a facial defect. ENDODONTIC: The patient currently denies acute pulpitis symptoms. Patient has had a previous root canal therapy associated with tooth #8.  [place] has a full gold crown on tooth #8 that is suboptimal. PROSTHODONTIC:   Patient has a temporary obturator recently fabricated by prosthodontist, Dr. Elzie Rings. Patient has a tongue positioner that was fabricated by Dr. Elzie Rings for use as a radiation [facility] locator for radiation therapy. However, trismus symptoms worsened and patient is unable to place the appliance in the mouth for the simulation procedure. Dr. Elzie Rings was contacted and will attempt to adjust the tongue positioner today to allow for future simulation appointment tomorrow with Dr. Isidore Moos. OCCLUSION:  Patient has a poor  occlusal scheme secondary to multiple missing teeth and deviation to the left on maximum opening.   RADIOGRAPHIC INTERPRETATION: Orthopantogram was taken today. There are multiple missing teeth. There is evidence of a left maxillectomy. There is incipient to moderate bone loss noted. There are radiopacities consistent with bilateral mandibular lingual tori. Tooth #8 has a crown restoration with previous root canal therapy. There is supra-eruption and drifting of the unopposed teeth into the edentulous areas. Surgical clips are located in the left neck area.    ASSESSMENTS: 1. Squamous cell carcinoma of the left hard palate-status post left partial maxillectomy 2. Preradiation therapy dental protocol 3.  Multiple missing teeth with surgical defect associated with the left partial maxillectomy 4. Chronic periodontitis with bone loss 5. Accretions 6. Selective areas of gingival recession 7. Suboptimal crown restoration on tooth #8 8. Xerostomia 9. Trismus with decreased maximum interincisal opening of 10-15 mm   PLAN/RECOMMENDATIONS: 1. I discussed the risks, benefits, and complications of various treatment options with the patient in relationship to his medical and dental conditions, anticipated radiation therapy, and risk for radiation therapy side effects to include xerostomia, radiation caries, trismus, mucositis, taste changes, gum and jawbone changes, and risk for infection and osteoradionecrosis.   We discussed various treatment options to include no treatment, fabrication of fluoride trays and scatter protection devices,  periodontal therapy, and return to oral maxillofacial prosthodontist, Dr. Elzie Rings, for adjustment of the existing tongue positioner. The patient currently wishes to proceed with returning to Dr. Elzie Rings for adjustment of the existing tongue positioner.  Dr. Elzie Rings has been contacted and agreed to have patient return to his office today for evaluation of the existing tongue positioner and possible modification of the tongue positioner due to the patient's inability to insert the tongue positioner at this time. I will prescribe PreviDent 5000 fluoride therapy to the patient with refills for one year.I recommended follow-up with his primary dentist, Dr. Tamala Julian, for a dental cleaning prior to start of radiation therapy.  The patient, however, did not want to proceed with the dental cleaning at this time and will follow-up with his primary dentist after radiation therapy for the continued periodontal therapy.   2. Discussion of findings with medical team and coordination of future medical and dental care as needed.  I spent in excess of  120 minutes during the conduct of this consultation  and >50% of this time involved direct face-to-face encounter for counseling and/or coordination of the patient's care.    Lenn Cal, DDS

## 2016-11-28 NOTE — Patient Instructions (Signed)

## 2016-11-29 ENCOUNTER — Ambulatory Visit
Admission: RE | Admit: 2016-11-29 | Discharge: 2016-11-29 | Disposition: A | Discharge status: Home / Self Care | Payer: BLUE CROSS/BLUE SHIELD | Source: Ambulatory Visit | Attending: Radiation Oncology | Admitting: Radiation Oncology

## 2016-11-29 DIAGNOSIS — C050 Malignant neoplasm of hard palate: Secondary | ICD-10-CM

## 2016-11-29 DIAGNOSIS — Z510 Encounter for antineoplastic radiation therapy: Secondary | ICD-10-CM | POA: Diagnosis not present

## 2016-11-29 NOTE — Progress Notes (Signed)
Head and Neck Cancer Simulation, IMRT treatment planning note   Outpatient  Diagnosis:    ICD-10-CM   1. 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 An Aquaplast head and shoulder mask was custom fitted to the patient's anatomy. High-resolution CT axial imaging was obtained of the head and neck. I verified that the quality of the imaging is good for treatment planning. 1 Medically Necessary Treatment Device was fabricated and supervised by me: Aquaplast mask.  Treatment planning note I plan to treat the patient with IMRT. I plan to treat the patient's tumor bed and ispilateral neck nodes. I plan to treat to a total dose of 60 [place] in 30  fractions. Dose calculation was ordered from dosimetry.  IMRT planning Note  IMRT is medically necessary and an important modality to deliver adequate dose to the patient's at risk tissues while sparing the patient's normal structures, including the: esophagus, parotid tissue, mandible, brain stem, spinal cord, oral cavity, brachial plexus.  This justifies the use of IMRT in the patient's treatment.   -----------------------------------  Eppie Gibson, MD

## 2016-11-30 MED ORDER — SODIUM FLUORIDE 1.1 % DT CREA: TOPICAL_CREAM | DENTAL | 99 refills | Status: AC

## 2016-12-04 ENCOUNTER — Telehealth: Payer: Self-pay | Admitting: *Deleted

## 2016-12-04 ENCOUNTER — Other Ambulatory Visit: Payer: Self-pay | Admitting: Radiation Oncology

## 2016-12-04 ENCOUNTER — Ambulatory Visit
Admission: RE | Admit: 2016-12-04 | Discharge: 2016-12-04 | Disposition: A | Discharge status: Home / Self Care | Payer: Self-pay | Source: Ambulatory Visit | Attending: Radiation Oncology | Admitting: Radiation Oncology

## 2016-12-04 DIAGNOSIS — C069 Malignant neoplasm of mouth, unspecified: Secondary | ICD-10-CM

## 2016-12-04 NOTE — Telephone Encounter (Signed)
Oncology Nurse Navigator Documentation  LVMM for Mr George Norris reminding him of tomorrow's 0900 arrival to Radiation Waiting after lobby registration for H&N [facility].  Gayleen Orem, RN, BSN, [place] Neck Oncology Nurse [place] at [place] [phone]

## 2016-12-05 ENCOUNTER — Ambulatory Visit: Payer: BLUE CROSS/BLUE SHIELD | Admitting: Nutrition

## 2016-12-05 ENCOUNTER — Encounter: Payer: Self-pay | Admitting: *Deleted

## 2016-12-05 ENCOUNTER — Ambulatory Visit: Payer: BLUE CROSS/BLUE SHIELD | Admitting: Radiation Oncology

## 2016-12-05 ENCOUNTER — Ambulatory Visit
Admission: RE | Admit: 2016-12-05 | Discharge: 2016-12-05 | Disposition: A | Discharge status: Home / Self Care | Payer: BLUE CROSS/BLUE SHIELD | Source: Ambulatory Visit | Attending: Radiation Oncology | Admitting: Radiation Oncology

## 2016-12-05 ENCOUNTER — Ambulatory Visit: Payer: BLUE CROSS/BLUE SHIELD | Attending: Radiation Oncology | Admitting: Physical Therapy

## 2016-12-05 DIAGNOSIS — Z483 Aftercare following surgery for neoplasm: Secondary | ICD-10-CM | POA: Diagnosis present

## 2016-12-05 DIAGNOSIS — C050 Malignant neoplasm of hard palate: Secondary | ICD-10-CM | POA: Diagnosis not present

## 2016-12-05 DIAGNOSIS — R29898 Other symptoms and signs involving the musculoskeletal system: Secondary | ICD-10-CM | POA: Diagnosis present

## 2016-12-05 NOTE — Progress Notes (Signed)
Oncology Nurse Navigator Documentation  Met with George Norris during H&N [place].  He was accompanied by his fiance.  Provided verbal and written overview of [place], the clinicians who will be seeing him, encouraged him to ask questions during his time with them.  He was seen by Nutrition, PT, SW and [place].  Appt with SLP will be scheduled for the near future.  Spoke with him at end of [facility], addressed questions. They understand I can be contacted with needs/concerns.  Gayleen Orem, RN, BSN, [place] at [place] [phone]

## 2016-12-05 NOTE — Therapy (Signed)
[place], [place], [address] Phone: [phone]   Fax:  [phone]  Physical Therapy Evaluation  Patient Details  Name: George Norris MRN: [id number] Date of Birth: 08-23-1965 Referring Provider: Dr. Eppie Gibson  Encounter Date: 12/05/2016      PT End of Session - 12/05/16 1050    Visit Number 1   Number of Visits 1   PT Start Time 0951   PT Stop Time 1020   PT Time Calculation (min) 29 min   Activity Tolerance Patient tolerated treatment well   Behavior During Therapy [facility] for tasks assessed/performed      Past Medical History:  Diagnosis Date  . Erectile dysfunction   . History of hand surgery 2008   right hand tendon surgery  . Hypercholesteremia     Past Surgical History:  Procedure Laterality Date  . history of hand surgery Right 2008   history of hand/ tendon surgery  . left neck dissection  10/24/2016  . MAXILLECTOMY Left 10/24/2016   Left infrastructure maxillectomy, placement of maxillary prosthesis    There were no vitals filed for this visit.       Subjective Assessment - 12/05/16 1038    Subjective Patient's girlfriend asks about therapy for his limited mouth opening. Patient himself speaks little today, mainly answering yes or no to questions.   Patient is accompained by: Family member  significant other, George Norris   Pertinent History 51 y.o. 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  From Dr. Pearlie Oyster note: "Dr. Conley Canal on 10/24/16 performed resection in the form of maxillectomy and modified radical neck dissection for the patient's hard palate lesion, left laterality. The tumor had a depth of invasion of 7 mm and it measured 2.9 x 2.7 x 1.2 cm. Grade 1-2 squamous cell carcinoma, with margins negative by less than 1 mm. The closest margin was superior. 38 lymph nodes removed and all were negative. No LVSI, but there was perineural invasion. Pathologic  stage T2N0. Of note, the patient had several teeth removed at the time of surgery."  Now wears oral prosthesis.  Plan is for adjuvant XRT in 30 fractions starting 12/11/16.   Patient Stated Goals get info from all head & neck clinic providers   Currently in Pain? 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 Eppie Gibson   Onset Date/Surgical Date 10/24/16  left maxillectomy and neck dissection   Hand Dominance Right   Prior Therapy none     Precautions   Precautions Other (comment)   Precaution Comments cancer precautions     Restrictions   Weight Bearing Restrictions No     Balance Screen   Has the patient fallen in the past 6 months No   Has the patient had a decrease in activity level because of a fear of falling?  No   Is the patient reluctant to leave their home because of a fear of falling?  No     Home Environment   Living Environment Private residence   Living Arrangements Spouse/significant other   Type of [place] One level     Prior Function   Level of [place] Other (comment)  on leave; works for a [employer]  4 days a week for about 30 minutes, about a mile     Cognition   Overall Cognitive Status Within Functional Limits for tasks assessed   Behaviors Other (comment)  answers mainly with yes or no     Observation/Other Assessments   Observations Pt. with facial asymmetry when he goes to open his mouth, the right opening wider than the left   Skin Integrity neck incision well healed     Functional Tests   Functional tests Sit to Stand     Sit to Stand   Comments 10 times in 30 seconds, below average for age  mild dyspnea following     Posture/Postural Control   Posture/Postural Control Postural limitations   Postural Limitations Forward head     ROM / Strength   AROM  / PROM / Strength AROM     AROM   Overall AROM Comments Neck AROM WFL except rotation limited 25% and sidebend to the right limited 20%; shoulder AROM WFL throughout; mouth opening limited and asymmetrical     Ambulation/Gait   Ambulation/Gait Yes   Ambulation/Gait Assistance 7: Independent           LYMPHEDEMA/ONCOLOGY QUESTIONNAIRE - 12/05/16 1047      Type   Cancer Type squamous cell of left hard palate     Surgeries   Other Surgery Date 10/24/16  maxillectomy and neck dissection   Number Lymph Nodes Removed 38     Lymphedema Assessments   Lymphedema Assessments Head and Neck     Head and Neck   4 cm superior to sternal notch around neck 40 cm   6 cm superior to sternal notch around neck 39.6 cm   8 cm superior to sternal notch around neck 40.2 cm   Other 10 cm. superior, 41.1         Objective measurements completed on examination: See above findings.                  PT Education - 12/05/16 1049    Education provided Yes   Education Details neck AROM, posture, breathing, walking, CURE article on staying active, lymphedema and PT info   Person(s) Educated Patient;Spouse   Methods Explanation;Handout   Comprehension Verbalized understanding                 Head and Neck Clinic Goals - 12/05/16 1056      Patient will be able to verbalize understanding of a home exercise program for cervical range of motion, posture, and walking.    Status Achieved     Patient will be able to verbalize understanding of proper sitting and standing posture.    Status Achieved     Patient will be able to verbalize understanding of lymphedema risk and availability of treatment for this condition.    Status Achieved            Plan - 12/05/16 1050    Clinical Impression Statement This is a pleasant gentleman s/p left maxillectomy for hard palate squamous cell carcinoma. He will undergo XRT soon.  He speaks little, mostly answering  yes or no. He demonstrates limited mouth opening and this is asymmetrical.  His significant other asked about therapy for this and they were encouraged to ask Dr. Conley Canal, the surgeon, about this next week when they have a follow-up with him, for him to make the referral.  He has limited neck ROM and performed below average on 30 second sit to stand.  Clinical Presentation Evolving   Clinical Presentation due to: healing from surgery and is to start XRT soon   Clinical Decision Making Low   Rehab Potential Good   PT Frequency One time visit   PT Treatment/Interventions Patient/family education   PT Next Visit Plan None at this time.  Patient plans to ask Dr. Conley Canal about a possible referral for limited mouth opening.  He may need therapy going forward should lymphedema develop.   PT Home Exercise Plan neck ROM, walking   Consulted and Agree with Plan of Care Patient      Patient will benefit from skilled therapeutic intervention in order to improve the following deficits and impairments:  Decreased range of motion, Decreased activity tolerance  Visit Diagnosis: Malignant neoplasm of hard palate (HCC) - Plan: PT plan of care cert/re-cert  Other symptoms and signs involving the musculoskeletal system - Plan: PT plan of care cert/re-cert  Aftercare following surgery for neoplasm - Plan: PT plan of care cert/re-cert     Problem List Patient Active Problem List   Diagnosis Date Noted  . Squamous cell carcinoma of hard palate ([place]) 11/15/2016    Donetta Isaza 12/05/2016, 10:57 AM  [place], [place], [address] Phone: [phone]   Fax:  [phone]  Name: George Norris MRN: [id number] Date of Birth: Nov 10, 1965  George Norris, PT 12/05/16 10:57 AM

## 2016-12-05 NOTE — Progress Notes (Signed)
Head & Neck Multidisciplinary Clinic Clinical Social Work  Clinical Social Work met with patient/family at head & neck multidisciplinary clinic to offer support and assess for psychosocial needs.  George Norris was accompanied by his fiance, Ramona.  They have two children, ages 68 and 6.  [name] shared he somewhat anxious regarding getting started with treatment but feels he is coping adequately at this time.  CSW discussed importance of patient and caregiver receiving emotional support through treatment.  CSW reviewed symptoms of depression and anxiety that are common in treatment.  The patient is most concerned with losing the ability to eat and keeping his weight.  Clinical Social Work briefly discussed Clinical Social Work role and [employer] support programs/services.  Clinical Social Work encouraged patient to call with any additional questions or concerns.   Maryjean Morn, MSW, LCSW, OSW-C Clinical Social Worker [facility] [phone]

## 2016-12-05 NOTE — Progress Notes (Signed)
Patient was seen during Head and neck clinic.  51 year old male diagnosed with cancer of the hard palate Patient is status post left maxillectomy and modified radical neck dissection on May 15. He is to receive adjuvant radiation therapy, 30 fractions, starting July 2.  I met with patient and his fiance, Ramona.  Past medical history includes hypercholesterolemia, tobacco, alcohol.  Medications were reviewed.  Labs were reviewed.  Height: 6 feet 0 inches. Weight: 177 pounds. Usual body weight: 185 pounds. BMI: 24.01.  Patient reports he does have difficulty chewing since his surgery. He denies other nutrition impact symptoms other than 8 pound weight loss which has occurred since he had surgery.  Nutrition diagnosis:  Unintended weight loss related to cancer of the hard palate and associated treatments as evidenced by 4% weight loss from usual body weight.  Intervention: Patient was educated to consume high-calorie, high-protein foods in small frequent meals and snacks. Reviewed importance of weight maintenance. Educated patient on strategies for incorporating softer foods. Educated patient on strategies for improving thick saliva. Provided fact sheets and answer questions. Teach back method was used and contact information was given.  Monitoring, evaluation, goals: Patient will tolerate adequate calories and protein to minimize weight loss.  Next visit: To be scheduled.  **Disclaimer: This note was dictated with voice recognition software. Similar sounding words can inadvertently be transcribed and this note may contain transcription errors which may not have been corrected upon publication of note.**

## 2016-12-06 ENCOUNTER — Ambulatory Visit: Payer: BLUE CROSS/BLUE SHIELD

## 2016-12-07 ENCOUNTER — Ambulatory Visit: Payer: BLUE CROSS/BLUE SHIELD

## 2016-12-08 ENCOUNTER — Ambulatory Visit: Payer: BLUE CROSS/BLUE SHIELD

## 2016-12-08 DIAGNOSIS — Z510 Encounter for antineoplastic radiation therapy: Secondary | ICD-10-CM | POA: Diagnosis not present

## 2016-12-11 ENCOUNTER — Ambulatory Visit
Admission: RE | Admit: 2016-12-11 | Discharge: 2016-12-11 | Disposition: A | Discharge status: Home / Self Care | Payer: BLUE CROSS/BLUE SHIELD | Source: Ambulatory Visit | Attending: Radiation Oncology | Admitting: Radiation Oncology

## 2016-12-11 ENCOUNTER — Encounter: Payer: Self-pay | Admitting: *Deleted

## 2016-12-11 DIAGNOSIS — C050 Malignant neoplasm of hard palate: Secondary | ICD-10-CM

## 2016-12-11 DIAGNOSIS — Z510 Encounter for antineoplastic radiation therapy: Secondary | ICD-10-CM | POA: Diagnosis not present

## 2016-12-11 MED ORDER — SONAFINE EX EMUL
1.0000 "application " | Freq: Once | CUTANEOUS | Status: AC
  Administered 2016-12-11: 1 via TOPICAL

## 2016-12-11 NOTE — Progress Notes (Signed)
Oncology Nurse Navigator Documentation  To provide support, encouragement and care continuity, met with George Norris for his initial [place] RT.  He was accompanied by his fiance.   I reviewed the 2-step treatment process, reminded him of the registration/arrival procedure for subsequent treatments.  His wife observed the treatment, RTT Melanie explained procedure.    [name] completed treatment without difficulty, denied questions/concerns. They understand to call me with needs/concerns.  Gayleen Orem, RN, BSN, [place] Neck Oncology Nurse [place] at [place] [phone]

## 2016-12-11 NOTE — Progress Notes (Signed)

## 2016-12-12 ENCOUNTER — Ambulatory Visit
Admission: RE | Admit: 2016-12-12 | Discharge: 2016-12-12 | Disposition: A | Discharge status: Home / Self Care | Payer: BLUE CROSS/BLUE SHIELD | Source: Ambulatory Visit | Attending: Radiation Oncology | Admitting: Radiation Oncology

## 2016-12-12 DIAGNOSIS — Z510 Encounter for antineoplastic radiation therapy: Secondary | ICD-10-CM | POA: Diagnosis not present

## 2016-12-14 ENCOUNTER — Ambulatory Visit
Admission: RE | Admit: 2016-12-14 | Discharge: 2016-12-14 | Disposition: A | Discharge status: Home / Self Care | Payer: BLUE CROSS/BLUE SHIELD | Source: Ambulatory Visit | Attending: Radiation Oncology | Admitting: Radiation Oncology

## 2016-12-14 DIAGNOSIS — Z510 Encounter for antineoplastic radiation therapy: Secondary | ICD-10-CM | POA: Diagnosis not present

## 2016-12-15 ENCOUNTER — Ambulatory Visit
Admission: RE | Admit: 2016-12-15 | Discharge: 2016-12-15 | Disposition: A | Discharge status: Home / Self Care | Payer: BLUE CROSS/BLUE SHIELD | Source: Ambulatory Visit | Attending: Radiation Oncology | Admitting: Radiation Oncology

## 2016-12-15 DIAGNOSIS — Z510 Encounter for antineoplastic radiation therapy: Secondary | ICD-10-CM | POA: Diagnosis not present

## 2016-12-18 ENCOUNTER — Ambulatory Visit
Admission: RE | Admit: 2016-12-18 | Discharge: 2016-12-18 | Disposition: A | Discharge status: Home / Self Care | Payer: BLUE CROSS/BLUE SHIELD | Source: Ambulatory Visit | Attending: Radiation Oncology | Admitting: Radiation Oncology

## 2016-12-18 DIAGNOSIS — Z510 Encounter for antineoplastic radiation therapy: Secondary | ICD-10-CM | POA: Diagnosis not present

## 2016-12-19 ENCOUNTER — Ambulatory Visit
Admission: RE | Admit: 2016-12-19 | Discharge: 2016-12-19 | Disposition: A | Discharge status: Home / Self Care | Payer: BLUE CROSS/BLUE SHIELD | Source: Ambulatory Visit | Attending: Radiation Oncology | Admitting: Radiation Oncology

## 2016-12-19 DIAGNOSIS — Z510 Encounter for antineoplastic radiation therapy: Secondary | ICD-10-CM | POA: Diagnosis not present

## 2016-12-20 ENCOUNTER — Ambulatory Visit
Admission: RE | Admit: 2016-12-20 | Discharge: 2016-12-20 | Disposition: A | Discharge status: Home / Self Care | Payer: BLUE CROSS/BLUE SHIELD | Source: Ambulatory Visit | Attending: Radiation Oncology | Admitting: Radiation Oncology

## 2016-12-20 ENCOUNTER — Encounter: Payer: Self-pay | Admitting: Nutrition

## 2016-12-20 DIAGNOSIS — Z510 Encounter for antineoplastic radiation therapy: Secondary | ICD-10-CM | POA: Diagnosis not present

## 2016-12-21 ENCOUNTER — Telehealth: Payer: Self-pay | Admitting: *Deleted

## 2016-12-21 ENCOUNTER — Encounter: Payer: Self-pay | Admitting: *Deleted

## 2016-12-21 ENCOUNTER — Ambulatory Visit
Admission: RE | Admit: 2016-12-21 | Discharge: 2016-12-21 | Disposition: A | Discharge status: Home / Self Care | Payer: BLUE CROSS/BLUE SHIELD | Source: Ambulatory Visit | Attending: Radiation Oncology | Admitting: Radiation Oncology

## 2016-12-21 DIAGNOSIS — Z510 Encounter for antineoplastic radiation therapy: Secondary | ICD-10-CM | POA: Diagnosis not present

## 2016-12-21 NOTE — Telephone Encounter (Signed)
Oncology Nurse Navigator Documentation  Received call from patient's fiance.  She provided phone and fax numbers for insurance company to facilitate STD documentation submission.  I noted we need signed Medical Information Release for [place], she understands I will provide form tomorrow when he arrives for RT.  She voiced understanding.  Gayleen Orem, RN, BSN, [place] Neck Oncology Nurse [place] at [place] [phone]

## 2016-12-21 NOTE — Progress Notes (Signed)
Oncology Nurse Navigator Documentation  Met with patient and his fiance following tomo tmt.  He reported he is tolerating tmts without difficulty.  I noted Neuro Rehab has been trying to schedule appt with SLP Garald Balding.  I emphasized importance of meeting with Glendell Docker to learn swallowing exercises, importance of conducting exercises during and after RT is completed.  They voiced understanding, fiance indicated she will contact scheduler to arrange appt.  Fiance noted insurance company is requesting additional documentation in order to extend STD.  She agreed to call me with contact information so I can facilitate request.  Gayleen Orem, RN, BSN, [place] at [place] [phone]

## 2016-12-22 ENCOUNTER — Ambulatory Visit
Admission: RE | Admit: 2016-12-22 | Discharge: 2016-12-22 | Disposition: A | Discharge status: Home / Self Care | Payer: BLUE CROSS/BLUE SHIELD | Source: Ambulatory Visit | Attending: Radiation Oncology | Admitting: Radiation Oncology

## 2016-12-22 DIAGNOSIS — Z510 Encounter for antineoplastic radiation therapy: Secondary | ICD-10-CM | POA: Diagnosis not present

## 2016-12-25 ENCOUNTER — Ambulatory Visit
Admission: RE | Admit: 2016-12-25 | Discharge: 2016-12-25 | Disposition: A | Discharge status: Home / Self Care | Payer: BLUE CROSS/BLUE SHIELD | Source: Ambulatory Visit | Attending: Radiation Oncology | Admitting: Radiation Oncology

## 2016-12-25 DIAGNOSIS — Z510 Encounter for antineoplastic radiation therapy: Secondary | ICD-10-CM | POA: Diagnosis not present

## 2016-12-26 ENCOUNTER — Telehealth: Payer: Self-pay | Admitting: *Deleted

## 2016-12-26 ENCOUNTER — Ambulatory Visit
Admission: RE | Admit: 2016-12-26 | Discharge: 2016-12-26 | Disposition: A | Discharge status: Home / Self Care | Payer: BLUE CROSS/BLUE SHIELD | Source: Ambulatory Visit | Attending: Radiation Oncology | Admitting: Radiation Oncology

## 2016-12-26 ENCOUNTER — Encounter: Payer: Self-pay | Admitting: *Deleted

## 2016-12-26 DIAGNOSIS — Z510 Encounter for antineoplastic radiation therapy: Secondary | ICD-10-CM | POA: Diagnosis not present

## 2016-12-26 NOTE — Telephone Encounter (Signed)
Oncology Nurse Navigator Documentation  In follow-up to patient's 7/12 documentation request to support extension of short-term disability, I prepared letter from Dr. Isidore Moos documenting patient's diagnosis, treatment plan, anticipated SEs, and recovery.  Letter signed by Dr. Sondra Come in Dr. Pearlie Oyster absence (on vacation) and faxed to [facility] [phone]); notification of successful fax transmission received.  I also spoke with [employer] agent [place] [phone]) prior to fax to notify of pending transmission.  She voiced understanding.  Letter documentation in Chart Review, Media.  Gayleen Orem, RN, BSN, [place] Neck Oncology Nurse [place] at [place] [phone]

## 2016-12-27 ENCOUNTER — Ambulatory Visit
Admission: RE | Admit: 2016-12-27 | Discharge: 2016-12-27 | Disposition: A | Discharge status: Home / Self Care | Payer: BLUE CROSS/BLUE SHIELD | Source: Ambulatory Visit | Attending: Radiation Oncology | Admitting: Radiation Oncology

## 2016-12-27 DIAGNOSIS — Z510 Encounter for antineoplastic radiation therapy: Secondary | ICD-10-CM | POA: Diagnosis not present

## 2016-12-28 ENCOUNTER — Ambulatory Visit
Admission: RE | Admit: 2016-12-28 | Discharge: 2016-12-28 | Disposition: A | Discharge status: Home / Self Care | Payer: BLUE CROSS/BLUE SHIELD | Source: Ambulatory Visit | Attending: Radiation Oncology | Admitting: Radiation Oncology

## 2016-12-28 ENCOUNTER — Ambulatory Visit: Payer: BLUE CROSS/BLUE SHIELD | Attending: Radiation Oncology

## 2016-12-28 ENCOUNTER — Ambulatory Visit: Payer: BLUE CROSS/BLUE SHIELD | Admitting: Nutrition

## 2016-12-28 DIAGNOSIS — R471 Dysarthria and anarthria: Secondary | ICD-10-CM | POA: Diagnosis present

## 2016-12-28 DIAGNOSIS — Z510 Encounter for antineoplastic radiation therapy: Secondary | ICD-10-CM | POA: Diagnosis not present

## 2016-12-28 DIAGNOSIS — R1310 Dysphagia, unspecified: Secondary | ICD-10-CM | POA: Insufficient documentation

## 2016-12-28 NOTE — Therapy (Signed)
[facility] [address] [place], [place], [address] Phone: [phone]   Fax:  [phone]  Speech Language Pathology Evaluation  Patient Details  Name: George Norris MRN: [id number] Date of Birth: Aug 01, 1965 Referring Provider: Eppie Gibson, MD  Encounter Date: 12/28/2016      End of Session - 12/28/16 1658    Visit Number 1   Number of Visits 7   Date for SLP Re-Evaluation 07/06/17   Authorization Type no info in EPIC; requested 12-27-16   SLP Start Time 1450   SLP Stop Time  1530   SLP Time Calculation (min) 40 min   Activity Tolerance Patient tolerated treatment well      Past Medical History:  Diagnosis Date  . Erectile dysfunction   . History of hand surgery 2008   right hand tendon surgery  . Hypercholesteremia     Past Surgical History:  Procedure Laterality Date  . history of hand surgery Right 2008   history of hand/ tendon surgery  . left neck dissection  10/24/2016  . MAXILLECTOMY Left 10/24/2016   Left infrastructure maxillectomy, placement of maxillary prosthesis    There were no vitals filed for this visit.          SLP Evaluation [facility] - 12/28/16 1652      SLP Visit Information   SLP Received On 12/28/16   Referring Provider Eppie Gibson, MD   Onset Date April 2018   Medical Diagnosis Maxillary/hard palatal CA     General Information   HPI Pt with left infrastructure maxillectomy at [facility] (Dr. Conley Canal) with maxillary prosthetic placed currently, today. Pt is 14 cycles into 35 to the tumor bed and to lt neck.     Prior Functional Status   Cognitive/Linguistic Baseline Within functional limits     Cognition   Overall Cognitive Status Within Functional Limits for tasks assessed     Auditory Comprehension   Overall Auditory Comprehension Appears within functional limits for tasks assessed     Verbal Expression   Overall Verbal Expression Appears within functional limits  for tasks assessed     Oral Motor/Sensory Function   Overall Oral Motor/Sensory Function Impaired   Labial ROM Within Functional Limits   Labial Symmetry Abnormal symmetry left   Labial Strength Reduced Left   Labial Sensation Reduced Left   Labial Coordination Reduced   Lingual ROM Reduced left   Lingual Symmetry Within Functional Limits   Lingual Strength Within Functional Limits   Lingual Sensation Within Functional Limits   Lingual Coordination WFL     Motor Speech   Overall Motor Speech Impaired   Articulation Impaired  mild, likely mostly to pt's numbness with lt labial muscles    Intelligibility Intelligible     Pt currently tolerates very soft foods/thin liquids. POs: Pt ate applesauce by 1/2 - 3/4 teaspoon and drank H2O without overt s/s aspiration. Thyroid elevation appeared adequate, and swallows appeared timely. Oral residue noted as minmal. Pt's swallow deemed WFL at this time.   Because data states the risk for dysphagia during and after radiation treatment is high due to undergoing radiation tx, SLP taught pt about the possibility of reduced/limited ability for PO intake during rad tx. SLP encouraged pt to complete HEP shortly after administration of pain meds PRN.   SLP educated pt re: changes to swallowing musculature after rad tx, and why adherence to dysphagia HEP provided today and PO consumption was necessary to inhibit muscular disuse atrophy and to reduce  muscle fibrosis following rad tx. Pt demonstrated understanding of these things to SLP.    SLP then developed a HEP for pt and pt was instructed how to perform exercises involving lingual, vocal, and pharyngeal strengthening. SLP performed each exercise and pt return demonstrated each exercise. SLP ensured pt performance was correct prior to moving on to next exercise. Pt was instructed to complete this program 2-3 times a day.                    SLP Education - 12/28/16 1658    Education  provided Yes   Education Details HEP, effects of head/neck radiation on swallowing ability   Person(s) Educated Patient;Other (comment)   Methods Explanation;Handout;Demonstration;Verbal cues   Comprehension Verbalized understanding;Returned demonstration;Verbal cues required;Need further instruction          SLP Short Term Goals - 12/28/16 1701      SLP SHORT TERM GOAL #1   Title pt will complete HEP with rare min A over two sessions   Time 2   Period --  visits   Status New     SLP SHORT TERM GOAL #2   Title pt will tell SLP why he is completing HEP    Time 2   Period --  visits   Status New     SLP SHORT TERM GOAL #3   Title pt will tell SLP why a food journal is helpful in returning to most liberal diet   Time 3   Period --  visits   Status New     SLP SHORT TERM GOAL #4   Title pt will tell SLP 3 overt s/s aspiration PNA with modified independence   Time 3   Period --  visits   Status New          SLP Long Term Goals - 12/28/16 1702      SLP LONG TERM GOAL #1   Title pt will complete HEP with modified independence over two sessions    Time 4   Period --  visits   Status New     SLP LONG TERM GOAL #2   Title pt will tell SLP when HEP frequency can be reduced to x2-3/week   Time 6   Period --  visits   Status New          Plan - 12/28/16 1659    Clinical Impression Statement Pt with oropharyngeal swallowing WFL for pureed and water, however pt reports he is not chewing with prosthesis and is adhering to primarily very soft diet. The probability of swallowing difficulty increases dramatically with the initiation of radiation therapy. Pt will need to be followed by SLP for regular assessment of accurate HEP completion as well as for safety with POs both during and following treatment/s.   Speech Therapy Frequency --  once approx every 4 weeks   Duration --  6 therapy visits   Treatment/Interventions Aspiration precaution training;Pharyngeal  strengthening exercises;Diet toleration management by SLP;Compensatory techniques;Environmental controls;Trials of upgraded texture/liquids;Internal/external aids;SLP instruction and feedback;Patient/family education  any or all may be used   Potential to Achieve Goals Good   SLP Home Exercise Plan provided today   Consulted and Agree with Plan of Care Patient      Patient will benefit from skilled therapeutic intervention in order to improve the following deficits and impairments:   Dysphagia, unspecified type - Plan: SLP plan of care cert/re-cert  Dysarthria - Plan: SLP plan of care cert/re-cert  Problem List Patient Active Problem List   Diagnosis Date Noted  . Squamous cell carcinoma of hard palate ([place]) 11/15/2016    [facility] ,[place], [place]  12/28/2016, 5:05 PM  [facility] [address] [place], [place], [address] Phone: [phone]   Fax:  [phone]  Name: George Norris MRN: [id number] Date of Birth: [birth year]

## 2016-12-28 NOTE — Progress Notes (Signed)
Nutrition follow-up completed with patient and fiance. Patient is receiving radiation therapy for cancer of the hard palate. Complains of very poor appetite and taste alterations. He drinks 2 oral nutrition supplements daily. Patient denies nausea, vomiting, constipation, and diarrhea. Weight improved documented as 179 pounds increased from 177 pounds.  Nutrition diagnosis: Unintended weight loss improved.  Intervention: I educated patient on strategies for increasing appetite by consuming smaller more frequent meals and snacks. Recommended patient consume high-calorie, high-protein foods and liquids. Educated patient on strategies to improve taste alterations. Encouraged a minimum of 2 oral nutrition supplements daily providing at least 350 cal and 15 g protein, I provided samples and coupons. Questions were answered.  Teach back method used.  Contact information given.  Monitoring, evaluation, goals: Patient will tolerate adequate calories and protein to minimize weight loss.  Next visit: Tuesday, July 24 after radiation therapy.  **Disclaimer: This note was dictated with voice recognition software. Similar sounding words can inadvertently be transcribed and this note may contain transcription errors which may not have been corrected upon publication of note.**

## 2016-12-28 NOTE — Patient Instructions (Signed)
SWALLOWING EXERCISES   1. Effortful Swallows - Press your tongue against the roof of your mouth for 3 seconds, then squeeze          the muscles in your neck while you swallow your saliva or a sip of water - Repeat 20 times, 2-3 times a day, and use whenever you eat or drink  2. Masako Swallow - swallow with your tongue sticking out - Stick tongue out past your teeth and gently bite tongue with your teeth - Swallow, while holding your tongue with your teeth - Repeat 20 times, 2-3 times a day *use a wet spoon if your mouth gets dry*  3. Shaker Exercise - head lift - Lie flat on your back in your bed or on a couch without pillows - Raise your head and look at your feet - KEEP YOUR SHOULDERS DOWN - HOLD FOR 45-60 SECONDS, then lower your head back down - Repeat 3 times, 2-3 times a day  4. Mendelsohn Maneuver - "half swallow" exercise - Start to swallow, and keep your Adam's apple up by squeezing hard with the            muscles of the throat - Hold the squeeze for 5-7 seconds and then relax - Repeat 20 times, 2-3 times a day *use a wet spoon if your mouth gets dry*  5. Tongue Stretch/Teeth Clean - Move your tongue around the pocket between your gums and teeth, clockwise and then counter-clockwise - Repeat on the back side, clockwise and then counter-clockwise - Repeat 15-20 times, 2-3 times a day  6. Chin pushback - Open your mouth  - Place your fist UNDER your chin near your neck, and push back with your fist for 5 seconds - Repeat 10 times, 2-3 times a day

## 2016-12-29 ENCOUNTER — Ambulatory Visit
Admission: RE | Admit: 2016-12-29 | Discharge: 2016-12-29 | Disposition: A | Discharge status: Home / Self Care | Payer: BLUE CROSS/BLUE SHIELD | Source: Ambulatory Visit | Attending: Radiation Oncology | Admitting: Radiation Oncology

## 2016-12-29 DIAGNOSIS — Z510 Encounter for antineoplastic radiation therapy: Secondary | ICD-10-CM | POA: Diagnosis not present

## 2017-01-01 ENCOUNTER — Other Ambulatory Visit: Payer: Self-pay | Admitting: Radiation Oncology

## 2017-01-01 ENCOUNTER — Ambulatory Visit
Admission: RE | Admit: 2017-01-01 | Discharge: 2017-01-01 | Disposition: A | Discharge status: Home / Self Care | Payer: BLUE CROSS/BLUE SHIELD | Source: Ambulatory Visit | Attending: Radiation Oncology | Admitting: Radiation Oncology

## 2017-01-01 DIAGNOSIS — Z510 Encounter for antineoplastic radiation therapy: Secondary | ICD-10-CM | POA: Diagnosis not present

## 2017-01-01 DIAGNOSIS — C050 Malignant neoplasm of hard palate: Secondary | ICD-10-CM

## 2017-01-01 MED ORDER — LIDOCAINE VISCOUS 2 % MT SOLN: OROMUCOSAL | 5 refills | Status: AC

## 2017-01-02 ENCOUNTER — Ambulatory Visit
Admission: RE | Admit: 2017-01-02 | Discharge: 2017-01-02 | Disposition: A | Discharge status: Home / Self Care | Payer: BLUE CROSS/BLUE SHIELD | Source: Ambulatory Visit | Attending: Radiation Oncology | Admitting: Radiation Oncology

## 2017-01-02 ENCOUNTER — Ambulatory Visit: Payer: BLUE CROSS/BLUE SHIELD | Admitting: Nutrition

## 2017-01-02 DIAGNOSIS — Z510 Encounter for antineoplastic radiation therapy: Secondary | ICD-10-CM | POA: Diagnosis not present

## 2017-01-02 NOTE — Progress Notes (Signed)
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 Patient reports he has absolutely no appetite. Reports food tastes awful, like the "radiation". He is able to swallow liquids without difficulty. Patient does not have feeding tube. Patient is receiving 30 fractions of radiation therapy is scheduled to complete radiation treatment on Monday, August 13.  Nutrition diagnosis: Unintended weight loss continues.  Intervention: Provided support and encouragement for patient to continue trying a variety of soft foods. Recommended patient consume a minimum of 6 Ensure Plus daily along with other soft foods to provide adequate calories and protein. I provided one complementary case of Ensure Plus. Teach back method used.  Monitoring, evaluation, goals: Patient will work to increase calories and protein to minimize weight loss.  Next visit: Tuesday, July 31.  After radiation therapy.  **Disclaimer: This note was dictated with voice recognition software. Similar sounding words can inadvertently be transcribed and this note may contain transcription errors which may not have been corrected upon publication of note.**

## 2017-01-03 ENCOUNTER — Ambulatory Visit
Admission: RE | Admit: 2017-01-03 | Discharge: 2017-01-03 | Disposition: A | Discharge status: Home / Self Care | Payer: BLUE CROSS/BLUE SHIELD | Source: Ambulatory Visit | Attending: Radiation Oncology | Admitting: Radiation Oncology

## 2017-01-03 DIAGNOSIS — Z510 Encounter for antineoplastic radiation therapy: Secondary | ICD-10-CM | POA: Diagnosis not present

## 2017-01-04 ENCOUNTER — Ambulatory Visit
Admission: RE | Admit: 2017-01-04 | Discharge: 2017-01-04 | Disposition: A | Discharge status: Home / Self Care | Payer: BLUE CROSS/BLUE SHIELD | Source: Ambulatory Visit | Attending: Radiation Oncology | Admitting: Radiation Oncology

## 2017-01-04 DIAGNOSIS — Z510 Encounter for antineoplastic radiation therapy: Secondary | ICD-10-CM | POA: Diagnosis not present

## 2017-01-05 ENCOUNTER — Ambulatory Visit
Admission: RE | Admit: 2017-01-05 | Discharge: 2017-01-05 | Disposition: A | Discharge status: Home / Self Care | Payer: BLUE CROSS/BLUE SHIELD | Source: Ambulatory Visit | Attending: Radiation Oncology | Admitting: Radiation Oncology

## 2017-01-05 DIAGNOSIS — Z510 Encounter for antineoplastic radiation therapy: Secondary | ICD-10-CM | POA: Diagnosis not present

## 2017-01-08 ENCOUNTER — Ambulatory Visit
Admission: RE | Admit: 2017-01-08 | Discharge: 2017-01-08 | Disposition: A | Discharge status: Home / Self Care | Payer: BLUE CROSS/BLUE SHIELD | Source: Ambulatory Visit | Attending: Radiation Oncology | Admitting: Radiation Oncology

## 2017-01-08 DIAGNOSIS — Z510 Encounter for antineoplastic radiation therapy: Secondary | ICD-10-CM | POA: Diagnosis not present

## 2017-01-09 ENCOUNTER — Ambulatory Visit: Payer: BLUE CROSS/BLUE SHIELD | Admitting: Nutrition

## 2017-01-09 ENCOUNTER — Ambulatory Visit
Admission: RE | Admit: 2017-01-09 | Discharge: 2017-01-09 | Disposition: A | Discharge status: Home / Self Care | Payer: BLUE CROSS/BLUE SHIELD | Source: Ambulatory Visit | Attending: Radiation Oncology | Admitting: Radiation Oncology

## 2017-01-09 DIAGNOSIS — Z510 Encounter for antineoplastic radiation therapy: Secondary | ICD-10-CM | POA: Diagnosis not present

## 2017-01-09 NOTE — Progress Notes (Signed)
Nutrition follow-up completed with patient receiving radiation therapy for cancer of the Hard Palate Patient continues to have absolutely no appetite. Food tastes awful "like" radiation therapy. He complains of constipation and notes last bowel movement was 2 days ago. He is only drinking 5 Ensure Plus daily. Patient does not have feeding tube.  Estimated nutrition needs: 2400-2600 calories, 100-120 grams protein, 2.6 L fluid.  Nutrition diagnosis:  Unintended weight loss cannot be evaluated as patient has not been weighed since last visit.  Intervention: Educated patient to begin taking something for constipation, and to speak with physician for recommendations. Recommended patient increase Ensure Plus to 7 bottles daily which will provide 2450 cal, 91 g protein. Provided second complimentary case of Ensure Plus. Questions were answered.  Teach back method used.  Monitoring, evaluation, goals:  Patient will increase oral intake to minimize weight loss.  Next visit: Tuesday, August 7, after radiation treatment.  **Disclaimer: This note was dictated with voice recognition software. Similar sounding words can inadvertently be transcribed and this note may contain transcription errors which may not have been corrected upon publication of note.**

## 2017-01-10 ENCOUNTER — Ambulatory Visit
Admission: RE | Admit: 2017-01-10 | Discharge: 2017-01-10 | Disposition: A | Discharge status: Home / Self Care | Payer: BLUE CROSS/BLUE SHIELD | Source: Ambulatory Visit | Attending: Radiation Oncology | Admitting: Radiation Oncology

## 2017-01-10 DIAGNOSIS — Z510 Encounter for antineoplastic radiation therapy: Secondary | ICD-10-CM | POA: Diagnosis not present

## 2017-01-11 ENCOUNTER — Ambulatory Visit
Admission: RE | Admit: 2017-01-11 | Discharge: 2017-01-11 | Disposition: A | Discharge status: Home / Self Care | Payer: BLUE CROSS/BLUE SHIELD | Source: Ambulatory Visit | Attending: Radiation Oncology | Admitting: Radiation Oncology

## 2017-01-11 ENCOUNTER — Encounter: Payer: Self-pay | Admitting: *Deleted

## 2017-01-11 DIAGNOSIS — Z510 Encounter for antineoplastic radiation therapy: Secondary | ICD-10-CM | POA: Diagnosis not present

## 2017-01-11 NOTE — Progress Notes (Signed)
Oncology Nurse Navigator Documentation  To provide support, encouragement and care continuity, met with George Norris and his fiance prior to RT. He reported:  Toleration of tmt without difficulty.  Hyperpigmentation of skin in treatment area, increasing dryness.  I assured him skin will return to baseline pigmentation following tmt, encouraged application of Sonafine B-TID, application of Neosporin to areas if breakdown occurs.  Difficulty eating soft foods.  They noted appt with Nutrition earlier this week, recommendation to increase to 7 bottles of Ensure.  I encouraged compliance, suggested more frequent smaller quantity consumption if difficulty with full bottles at one sitting.  Minimal compliance with SLP HEP.  I reinforced rational for twice daily execution of all exercises, he stated he would try. They understand I can be contacted with needs/concerns.  Gayleen Orem, RN, BSN, [place] Neck Oncology Nurse [place] at [place] [phone]

## 2017-01-12 ENCOUNTER — Ambulatory Visit: Payer: BLUE CROSS/BLUE SHIELD

## 2017-01-15 ENCOUNTER — Ambulatory Visit
Admission: RE | Admit: 2017-01-15 | Discharge: 2017-01-15 | Disposition: A | Discharge status: Home / Self Care | Payer: BLUE CROSS/BLUE SHIELD | Source: Ambulatory Visit | Attending: Radiation Oncology | Admitting: Radiation Oncology

## 2017-01-15 DIAGNOSIS — Z510 Encounter for antineoplastic radiation therapy: Secondary | ICD-10-CM | POA: Diagnosis not present

## 2017-01-16 ENCOUNTER — Ambulatory Visit: Payer: BLUE CROSS/BLUE SHIELD

## 2017-01-16 ENCOUNTER — Ambulatory Visit
Admission: RE | Admit: 2017-01-16 | Discharge: 2017-01-16 | Disposition: A | Discharge status: Home / Self Care | Payer: BLUE CROSS/BLUE SHIELD | Source: Ambulatory Visit | Attending: Radiation Oncology | Admitting: Radiation Oncology

## 2017-01-16 ENCOUNTER — Ambulatory Visit: Payer: BLUE CROSS/BLUE SHIELD | Admitting: Nutrition

## 2017-01-16 DIAGNOSIS — Z510 Encounter for antineoplastic radiation therapy: Secondary | ICD-10-CM | POA: Diagnosis not present

## 2017-01-16 NOTE — Progress Notes (Signed)
Nutrition follow-up completed with patient and his fiance.  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 Reports he can only drink four Ensure Plus daily.  He is trying to drink goal rate of 7 bottles daily to meet estimated nutrition needs. He reports he has lost 4 pounds since last week  Nutrition diagnosis: Unintended weight loss continues.  Intervention: Provided support and encouragement for patient to continue Ensure Plus up to 7 bottles daily. Encouraged meals and snacks.  If tolerated. Provided third complementary case of Ensure Plus. Teach back method used.  Monitoring, evaluation, goals:  Patient will work to increase calories and protein to minimize weight loss.  No follow-up is scheduled.  Patient has my contact information.  **Disclaimer: This note was dictated with voice recognition software. Similar sounding words can inadvertently be transcribed and this note may contain transcription errors which may not have been corrected upon publication of note.**

## 2017-01-17 ENCOUNTER — Ambulatory Visit
Admission: RE | Admit: 2017-01-17 | Discharge: 2017-01-17 | Disposition: A | Discharge status: Home / Self Care | Payer: BLUE CROSS/BLUE SHIELD | Source: Ambulatory Visit | Attending: Radiation Oncology | Admitting: Radiation Oncology

## 2017-01-17 DIAGNOSIS — Z510 Encounter for antineoplastic radiation therapy: Secondary | ICD-10-CM | POA: Diagnosis not present

## 2017-01-18 ENCOUNTER — Ambulatory Visit
Admission: RE | Admit: 2017-01-18 | Discharge: 2017-01-18 | Disposition: A | Discharge status: Home / Self Care | Payer: BLUE CROSS/BLUE SHIELD | Source: Ambulatory Visit | Attending: Radiation Oncology | Admitting: Radiation Oncology

## 2017-01-18 DIAGNOSIS — Z510 Encounter for antineoplastic radiation therapy: Secondary | ICD-10-CM | POA: Diagnosis not present

## 2017-01-19 ENCOUNTER — Ambulatory Visit
Admission: RE | Admit: 2017-01-19 | Discharge: 2017-01-19 | Disposition: A | Discharge status: Home / Self Care | Payer: BLUE CROSS/BLUE SHIELD | Source: Ambulatory Visit | Attending: Radiation Oncology | Admitting: Radiation Oncology

## 2017-01-19 DIAGNOSIS — Z510 Encounter for antineoplastic radiation therapy: Secondary | ICD-10-CM | POA: Diagnosis not present

## 2017-01-22 ENCOUNTER — Encounter: Payer: Self-pay | Admitting: Nutrition

## 2017-01-22 ENCOUNTER — Other Ambulatory Visit: Payer: Self-pay

## 2017-01-22 ENCOUNTER — Ambulatory Visit
Admission: RE | Admit: 2017-01-22 | Discharge: 2017-01-22 | Disposition: A | Discharge status: Home / Self Care | Payer: BLUE CROSS/BLUE SHIELD | Source: Ambulatory Visit | Attending: Radiation Oncology | Admitting: Radiation Oncology

## 2017-01-22 ENCOUNTER — Ambulatory Visit: Payer: BLUE CROSS/BLUE SHIELD

## 2017-01-22 ENCOUNTER — Other Ambulatory Visit: Payer: Self-pay | Admitting: Radiation Oncology

## 2017-01-22 DIAGNOSIS — C050 Malignant neoplasm of hard palate: Secondary | ICD-10-CM

## 2017-01-22 DIAGNOSIS — Z510 Encounter for antineoplastic radiation therapy: Secondary | ICD-10-CM | POA: Diagnosis not present

## 2017-01-22 LAB — COMPREHENSIVE METABOLIC PANEL
ALT: 13 U/L (ref 0–55)
AST: 13 U/L (ref 5–34)
Albumin: 4 g/dL (ref 3.5–5.0)
Alkaline Phosphatase: 74 U/L (ref 40–150)
Anion Gap: 6 mEq/L (ref 3–11)
BILIRUBIN TOTAL: 0.5 mg/dL (ref 0.20–1.20)
BUN: 17.8 mg/dL (ref 7.0–26.0)
CHLORIDE: 103 meq/L (ref 98–109)
CO2: 31 meq/L — AB (ref 22–29)
CREATININE: 1 mg/dL (ref 0.7–1.3)
Calcium: 10 mg/dL (ref 8.4–10.4)
EGFR: >90 mL/min/{1.73_m2} (ref >90)
GLUCOSE: 120 mg/dL (ref 70–140)
Potassium: 4.8 mEq/L (ref 3.5–5.1)
Sodium: 140 mEq/L (ref 136–145)
Total Protein: 7.8 g/dL (ref 6.4–8.3)

## 2017-01-22 LAB — CBC WITH DIFFERENTIAL/PLATELET
BASO%: 0.2 % (ref 0.0–2.0)
Basophils Absolute: 0 10*3/uL (ref 0.0–0.1)
EOS ABS: 0.1 10*3/uL (ref 0.0–0.5)
EOS%: 2.9 % (ref 0.0–7.0)
HCT: 41.1 % (ref 38.4–49.9)
HGB: 13.7 g/dL (ref 13.0–17.1)
LYMPH%: 8.3 % — AB (ref 14.0–49.0)
MCH: 27.5 pg (ref 27.2–33.4)
MCHC: 33.3 g/dL (ref 32.0–36.0)
MCV: 82.4 fL (ref 79.3–98.0)
MONO#: 0.6 10*3/uL (ref 0.1–0.9)
MONO%: 11.4 % (ref 0.0–14.0)
NEUT%: 77.2 % — AB (ref 39.0–75.0)
NEUTROS ABS: 3.7 10*3/uL (ref 1.5–6.5)
Platelets: 229 10*3/uL (ref 140–400)
RBC: 4.99 10*6/uL (ref 4.20–5.82)
RDW: 13.8 % (ref 11.0–14.6)
WBC: 4.8 10*3/uL (ref 4.0–10.3)
lymph#: 0.4 10*3/uL — ABNORMAL LOW (ref 0.9–3.3)

## 2017-01-22 LAB — MAGNESIUM: Magnesium: 2.5 mg/dl (ref 1.5–2.5)

## 2017-01-22 MED ORDER — LORAZEPAM 0.5 MG PO TABS: 0.5000 mg | ORAL_TABLET | Freq: Three times a day (TID) | ORAL | 0 refills | Status: AC | PRN

## 2017-01-22 MED ORDER — HYDROCODONE-ACETAMINOPHEN 7.5-325 MG/15ML PO SOLN: 10.0000 mL | Freq: Four times a day (QID) | ORAL | 0 refills | Status: AC | PRN

## 2017-01-23 ENCOUNTER — Ambulatory Visit: Payer: BLUE CROSS/BLUE SHIELD

## 2017-01-23 ENCOUNTER — Ambulatory Visit (HOSPITAL_COMMUNITY)
Admission: RE | Admit: 2017-01-23 | Discharge: 2017-01-23 | Disposition: A | Discharge status: Home / Self Care | Payer: BLUE CROSS/BLUE SHIELD | Source: Ambulatory Visit | Attending: Radiation Oncology | Admitting: Radiation Oncology

## 2017-01-23 ENCOUNTER — Ambulatory Visit
Admission: RE | Admit: 2017-01-23 | Discharge: 2017-01-23 | Disposition: A | Discharge status: Home / Self Care | Payer: BLUE CROSS/BLUE SHIELD | Source: Ambulatory Visit | Attending: Radiation Oncology | Admitting: Radiation Oncology

## 2017-01-23 DIAGNOSIS — Z510 Encounter for antineoplastic radiation therapy: Secondary | ICD-10-CM | POA: Diagnosis not present

## 2017-01-23 DIAGNOSIS — C050 Malignant neoplasm of hard palate: Secondary | ICD-10-CM | POA: Diagnosis present

## 2017-01-23 MED ORDER — SODIUM CHLORIDE 0.9 % IV SOLN
Freq: Once | INTRAVENOUS | Status: AC
  Administered 2017-01-23: 11:00:00 via INTRAVENOUS

## 2017-01-23 NOTE — Discharge Instructions (Signed)
Patient received 0.9 % sodium chloride infusion 8/14.

## 2017-01-23 NOTE — Progress Notes (Signed)
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 Squires  Procedure: Pt received 1 liter of normal saline over 2 hours.  Pt tolerated procedure well.  Post procedure: Pt tolerated procedure well. Pt was alert, oriented and ambulatory at discharge. D/C instructions given with verbal understanding.

## 2017-01-30 ENCOUNTER — Ambulatory Visit: Payer: BLUE CROSS/BLUE SHIELD | Attending: Radiation Oncology

## 2017-02-05 NOTE — Progress Notes (Signed)
  George Norris presents for follow up of radiation completed 01/23/17.   Pain issues, if any: He denies pain Using a feeding tube?: N/A Weight changes, if any:  02/07/17 169.8 lb 01/22/17 167.4 lb 01/15/17 170.4 lb Swallowing issues, if any: He denies difficulty swallowing. He has not tried any solid foods due to taste changes. He is drinking 6 ensure daily and drinking water Smoking or chewing tobacco? No Using fluoride trays daily? He is using fluoride toothpaste Last ENT visit was on: Dr. Conley Canal 01/29/17 Other notable issues, if any:   BP 127/73   Pulse 93   Temp 98.5 F (36.9 C)   Ht 6' (1.829 m)   Wt 169 lb 12.8 oz (77 kg)   SpO2 100% Comment: room air  BMI 23.03 kg/m

## 2017-02-06 ENCOUNTER — Encounter: Payer: Self-pay | Admitting: Radiation Oncology

## 2017-02-06 NOTE — Progress Notes (Signed)
  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 He developed diffuse oral mucositis. He was given sublingual ativan for nausea, hycet solution for mucositis, and IV fluids for dehydration.   Plan: The patient has completed radiation treatment. The patient will return to radiation oncology clinic for routine followup in one month. I advised them to call or return sooner if they have any questions or concerns related to their recovery or treatment.  -----------------------------------  Eppie Gibson, MD  This document serves as a record of services personally performed by Eppie Gibson, MD. It was created on her behalf by Margit Banda, a trained medical scribe. The creation of this record is based on the scribe's personal observations and the provider's statements to them. This document has been checked and approved by the attending provider.

## 2017-02-07 ENCOUNTER — Ambulatory Visit
Admission: RE | Admit: 2017-02-07 | Discharge: 2017-02-07 | Disposition: A | Discharge status: Home / Self Care | Payer: BLUE CROSS/BLUE SHIELD | Source: Ambulatory Visit | Attending: Radiation Oncology | Admitting: Radiation Oncology

## 2017-02-07 ENCOUNTER — Encounter: Payer: Self-pay | Admitting: Radiation Oncology

## 2017-02-07 ENCOUNTER — Encounter: Payer: Self-pay | Admitting: *Deleted

## 2017-02-07 DIAGNOSIS — Z923 Personal history of irradiation: Secondary | ICD-10-CM | POA: Diagnosis not present

## 2017-02-07 DIAGNOSIS — Z79899 Other long term (current) drug therapy: Secondary | ICD-10-CM | POA: Insufficient documentation

## 2017-02-07 DIAGNOSIS — C050 Malignant neoplasm of hard palate: Secondary | ICD-10-CM | POA: Insufficient documentation

## 2017-02-07 DIAGNOSIS — Z08 Encounter for follow-up examination after completed treatment for malignant neoplasm: Secondary | ICD-10-CM | POA: Diagnosis not present

## 2017-02-07 NOTE — Progress Notes (Signed)
Oncology Nurse Navigator Documentation  Met with George Norris and his fiance during post-tmt 2-week follow-up with Dr. Isidore Moos. He arrived today with mild L neck lymphedema, voiced understanding I will coordinate follow-up with PT Serafina Royals. We discussed gradual return of taste sensation can be expected during ensuing months. I provided information for upcoming H&N [facility], discussed benefit, encouraged their attendance. They voiced understanding I can be contacted with questions/needs.  Gayleen Orem, RN, BSN, [place] Neck Oncology Nurse [place] at [place] [phone]

## 2017-02-07 NOTE — Progress Notes (Signed)
Radiation Oncology         [phone]) [phone] ________________________________  Name: George Norris MRN: [id number]  Date: 02/07/2017  DOB: 10-07-1965  Follow-Up Visit Note  CC: Sharilyn Sites, MD  Fredricka Bonine, *  Diagnosis and Prior Radiotherapy:       ICD-10-CM   1. 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 His hard palate was treated to 60 Gy in 30 fractions from 12/11/2016 to 01/23/2017.  Pain issues, if any: He denies pain  Using a feeding tube?: N/A  Weight changes, if any:  02/07/17 169.8 lb 01/22/17 167.4 lb 01/15/17 170.4 lb  Swallowing issues, if any: He denies. He has not tried any solid foods due to taste changes. He is drinking 6 Ensure daily and drinking water.  Smoking or chewing tobacco? No  Using fluoride trays daily? He is using fluoride toothpaste  Last ENT visit was on: Dr. Conley Canal 01/29/17  Other notable issues, if any:  He reports swelling in his neck.            ALLERGIES:  has No Known Allergies.  Meds: Current Outpatient Prescriptions  Medication Sig Dispense Refill  . sodium chloride (OCEAN) 0.65 % nasal spray Place into the nose.    Marland Kitchen HYDROcodone-acetaminophen (HYCET) 7.5-325 mg/15 ml solution Take 10 mLs by mouth 4 (four) times daily as needed for moderate pain. (Patient not taking: Reported on 02/07/2017) 120 mL 0  . lidocaine (XYLOCAINE) 2 % solution Patient: Mix 1part 2% viscous lidocaine, 1part H20. Swish & swallow 45mL of diluted mixture, 48min before meals and at bedtime, up to QID (Patient not taking: Reported on 02/07/2017) 100 mL 5  . LORazepam (ATIVAN) 0.5 MG tablet Place 1 tablet (0.5 mg total) under the tongue every 8 (eight) hours as needed. nausea (Patient not taking: Reported  on 02/07/2017) 30 tablet 0  . sodium fluoride (PREVIDENT 5000 PLUS) 1.1 % CREA dental cream Apply to tooth brush. Brush teeth for 2 minutes. Spit out excess. Repeat nightly. (Patient not taking: Reported on 02/07/2017) 1 Tube prn   No current facility-administered medications for this encounter.    Review of Systems: as above  Physical Findings: Wt Readings from Last 3 Encounters:  02/07/17 169 lb 12.8 oz (77 kg)  01/02/17 176 lb (79.8 kg)  12/05/16 177 lb (80.3 kg)    height is 6' (1.829 m) and weight is 169 lb 12.8 oz (77 kg). His temperature is 98.5 F (36.9 C). His blood pressure is 127/73 and his pulse is 93. His oxygen saturation is 100%. .  General: Alert and oriented, in no acute distress. HEENT: Dentures were removed for oral exam. Oropharynx is notable for trismus. Defect in the hard palate and erythema in the maxillary region on the left. Tiny focus of what appears to be whitish ?necrotic tissue in the left  maxillary sinus treatment field which is stable compared to his prior exam.  Neck: Neck is notable for no palpable adenopathy. Mild lymphedema in the left neck. Skin: Skin in treatment fields shows satisfactory healing with little dryness and hyperpigmentation. Lymphatics: see Neck Exam Psychiatric: Judgment and insight are intact. Affect is appropriate.  HEENT photo:   Lab Findings: Lab Results  Component Value Date   WBC 4.8 01/22/2017  HGB 13.7 01/22/2017   HCT 41.1 01/22/2017   MCV 82.4 01/22/2017   PLT 229 01/22/2017    Lab Results  Component Value Date   TSH 1.009 11/24/2016    Radiographic Findings: No results found.  Impression/Plan:    1) Head and Neck Cancer Status: Healing from radiotherapy.  2) Nutritional Status: Weight is stable. Drinking 6 Ensure daily. PEG tube: N/A  4) Swallowing: He denies difficulty swallowing.  5) Dental: Encouraged to continue regular followup with dentistry, and dental hygiene including fluoride rinses.  6)  Thyroid function:  Lab Results  Component Value Date   TSH 1.009 11/24/2016    7) Other: Referral to PT.  8) Follow up with Dr. Conley Canal in 6 weeks. I will defer to him to get imaging at [facility] since baseline scans were there - email sent re: this and with a photo from physical exam to get his opinion re: focus of whitish tissue that is stable from appearance during last weeks of RT. Follow-up in radiation oncology in 3 months. The patient was encouraged to call with any issues or questions before then.  I spent 10 minutes face to face with the patient and more than 50% of that time was spent in counseling and/or coordination of care. _____________________________________   Eppie Gibson, MD  This document serves as a record of services personally performed by Eppie Gibson, MD. It was created on her behalf by Rae Lips, a trained medical scribe. The creation of this record is based on the scribe's personal observations and the provider's statements to them. This document has been checked and approved by the attending provider.

## 2017-02-08 ENCOUNTER — Other Ambulatory Visit: Payer: Self-pay | Admitting: Radiation Oncology

## 2017-02-08 DIAGNOSIS — R5383 Other fatigue: Secondary | ICD-10-CM

## 2017-02-08 DIAGNOSIS — R634 Abnormal weight loss: Secondary | ICD-10-CM

## 2017-02-13 ENCOUNTER — Encounter: Payer: Self-pay | Admitting: *Deleted

## 2017-02-13 NOTE — Progress Notes (Signed)
On 02-13-17 fax the follow up note from 02-07-17 to [facility] medical management

## 2017-02-20 ENCOUNTER — Ambulatory Visit: Payer: BLUE CROSS/BLUE SHIELD | Attending: Radiation Oncology | Admitting: Physical Therapy

## 2017-02-27 ENCOUNTER — Ambulatory Visit: Payer: BLUE CROSS/BLUE SHIELD

## 2017-03-02 ENCOUNTER — Telehealth: Payer: Self-pay | Admitting: *Deleted

## 2017-03-02 NOTE — Telephone Encounter (Signed)
Oncology Nurse Navigator Documentation  Spoke with George Norris' fiance, George Norris, in follow-up her call yesterday re his lack of appetite.  Explained Dr. Pearlie Oyster hesitancy to prescribe medication at this time.  I indicated I spoke with Nutritionist Allie who recommended that he try to eat a variety of small frequent meals q 2H in conjunction with supplement with the goal of increased appetite as he eats solid food. I encouraged his attendance at next Tuesday morning's H&N [place] so he can meet with Dory Peru, Nutritionist, as well as SLP Garald Balding (with whom he has cancelled b/c he doesn't think needed). She indicated she will check with him, call me back later today.  Gayleen Orem, RN, BSN, [place] Neck Oncology Nurse [place] at [place] [phone]

## 2017-03-05 ENCOUNTER — Telehealth: Payer: Self-pay | Admitting: *Deleted

## 2017-03-05 NOTE — Telephone Encounter (Signed)
Oncology Nurse Navigator Documentation  Spoke with Mr Lun' fiance, [place].  She indicated he does not want to attend H&N [place] tomorrow to see SLP and Nutrition.  I indicated I would have Dory Peru, Nutrition, call her to provide additional nutritional guidance.  Gayleen Orem, RN, BSN, [place] Neck Oncology Nurse [place] at [place] [phone]

## 2017-03-06 ENCOUNTER — Ambulatory Visit: Payer: BLUE CROSS/BLUE SHIELD

## 2017-03-06 ENCOUNTER — Encounter: Payer: Self-pay | Admitting: Nutrition

## 2017-05-09 ENCOUNTER — Telehealth (HOSPITAL_COMMUNITY): Payer: Self-pay | Admitting: Dentistry

## 2017-05-09 NOTE — Telephone Encounter (Signed)
05/08/17  Called pt. to schedule F/U appt. w/Dr. Enrique Sack.  Pt. stated he would follow up w/his dentist for treatment.  LRI

## 2017-05-14 ENCOUNTER — Encounter: Payer: Self-pay | Admitting: Radiation Oncology

## 2017-05-14 NOTE — Progress Notes (Signed)
error 

## 2017-05-18 ENCOUNTER — Ambulatory Visit: Payer: BLUE CROSS/BLUE SHIELD

## 2017-05-18 ENCOUNTER — Ambulatory Visit
Admission: RE | Admit: 2017-05-18 | Discharge: 2017-05-18 | Disposition: A | Discharge status: Home / Self Care | Payer: BLUE CROSS/BLUE SHIELD | Source: Ambulatory Visit | Attending: Radiation Oncology | Admitting: Radiation Oncology

## 2017-05-18 DIAGNOSIS — Z08 Encounter for follow-up examination after completed treatment for malignant neoplasm: Secondary | ICD-10-CM | POA: Insufficient documentation

## 2017-05-18 DIAGNOSIS — C050 Malignant neoplasm of hard palate: Secondary | ICD-10-CM | POA: Insufficient documentation

## 2017-05-18 DIAGNOSIS — Z79899 Other long term (current) drug therapy: Secondary | ICD-10-CM | POA: Insufficient documentation

## 2017-05-18 DIAGNOSIS — Z923 Personal history of irradiation: Secondary | ICD-10-CM | POA: Insufficient documentation

## 2017-05-18 HISTORY — DX: Personal history of irradiation: Z92.3

## 2017-10-11 ENCOUNTER — Telehealth: Payer: Self-pay | Admitting: *Deleted

## 2017-10-11 NOTE — Telephone Encounter (Signed)
Received call from [place] stating that pt needs an obturater but he needs a Ba Swallow test done to r/o aspiration risk.  Call back # is [phone].  Message routed to [place] left message on his voice mail & paged him.  Also routed to Dr Isidore Moos & Harlow Asa RN

## 2017-10-19 ENCOUNTER — Other Ambulatory Visit (HOSPITAL_COMMUNITY): Payer: Self-pay | Admitting: Radiation Oncology

## 2017-10-19 ENCOUNTER — Telehealth: Payer: Self-pay | Admitting: *Deleted

## 2017-10-19 ENCOUNTER — Other Ambulatory Visit: Payer: Self-pay | Admitting: Radiation Oncology

## 2017-10-19 DIAGNOSIS — R1310 Dysphagia, unspecified: Secondary | ICD-10-CM

## 2017-10-19 DIAGNOSIS — C050 Malignant neoplasm of hard palate: Secondary | ICD-10-CM

## 2017-10-19 NOTE — Telephone Encounter (Signed)
CALLED PATIENT TO INFORM OF MBS ON 10-29-17- ARRIVAL TIME- 12:45 PM @ [facility], NO RESTRICTIONS TO TEST, NO ANSWER, MAILED APPT. CARD

## 2017-10-19 NOTE — Telephone Encounter (Signed)
PATIENT'S MBS APPT. MOVED TO 10-30-17 - ARRIVAL TIME - 12:15 PM @ [facility], NO RESTRICTIONS TO TEST, NO ANSWER MAILED APPT. CARD

## 2017-10-29 ENCOUNTER — Telehealth: Payer: Self-pay | Admitting: *Deleted

## 2017-10-29 NOTE — Telephone Encounter (Signed)
Oncology Nurse Navigator Documentation  Rec'd call from [place], [facility], confirmed pt's 5/21 1:00 MBS appt.  Gayleen Orem, RN, BSN Head & Neck Oncology Nurse [place] at [place] [phone]

## 2017-10-30 ENCOUNTER — Ambulatory Visit (HOSPITAL_COMMUNITY)
Admission: RE | Admit: 2017-10-30 | Discharge: 2017-10-30 | Disposition: A | Discharge status: Home / Self Care | Payer: BLUE CROSS/BLUE SHIELD | Source: Ambulatory Visit | Attending: Radiation Oncology | Admitting: Radiation Oncology

## 2017-10-30 ENCOUNTER — Ambulatory Visit (HOSPITAL_COMMUNITY): Payer: BLUE CROSS/BLUE SHIELD

## 2017-10-30 DIAGNOSIS — C050 Malignant neoplasm of hard palate: Secondary | ICD-10-CM

## 2017-11-21 ENCOUNTER — Other Ambulatory Visit (HOSPITAL_COMMUNITY): Payer: Self-pay | Admitting: Specialist

## 2017-11-21 ENCOUNTER — Telehealth: Payer: Self-pay | Admitting: *Deleted

## 2017-11-21 DIAGNOSIS — R1319 Other dysphagia: Secondary | ICD-10-CM

## 2017-11-21 NOTE — Telephone Encounter (Addendum)
Oncology Nurse Navigator Documentation  Received VMM from [place], [place], indicating MBS appt scheduled/confirmed with pt SO for tomorrow 1:30. Confirmed appt with pt's fiancee Ramona. [place], [facility] [phone]), informed her of appt, provided RT dosage/tmt information.  Gayleen Orem, RN, BSN Head & Neck Oncology Nurse [place] at [place] [phone]

## 2017-11-21 NOTE — Telephone Encounter (Signed)
Oncology Nurse Navigator Documentation  S/p pt's SO's 6/7 call asking for appt for MBS and my subsequent call to [facility] requesting arrangement of appt, rec'd call from patient's SO indicating she has not rec'd call.  She noted urgency for appt, again noted preference for [place]/[facility] location, for her to be contacted.  I called [facility], spoke with Ellard Artis, she indicated she had sent email to [facility] for scheduling per our conversation, will call to have them contact SO ASAP.  Gayleen Orem, RN, BSN Head & Neck Oncology Nurse [place] at [place] [phone]

## 2017-11-22 ENCOUNTER — Ambulatory Visit (HOSPITAL_COMMUNITY): Payer: BLUE CROSS/BLUE SHIELD | Attending: Radiation Oncology | Admitting: Speech Pathology

## 2017-11-22 ENCOUNTER — Other Ambulatory Visit (HOSPITAL_COMMUNITY): Payer: Self-pay | Admitting: Radiation Oncology

## 2017-11-22 ENCOUNTER — Encounter (HOSPITAL_COMMUNITY): Payer: Self-pay | Admitting: Speech Pathology

## 2017-11-22 ENCOUNTER — Other Ambulatory Visit: Payer: Self-pay

## 2017-11-22 ENCOUNTER — Ambulatory Visit (HOSPITAL_COMMUNITY)
Admission: RE | Admit: 2017-11-22 | Discharge: 2017-11-22 | Disposition: A | Discharge status: Home / Self Care | Payer: BLUE CROSS/BLUE SHIELD | Source: Ambulatory Visit | Attending: Radiation Oncology | Admitting: Radiation Oncology

## 2017-11-22 DIAGNOSIS — R1319 Other dysphagia: Secondary | ICD-10-CM

## 2017-11-22 DIAGNOSIS — R1311 Dysphagia, oral phase: Secondary | ICD-10-CM | POA: Diagnosis not present

## 2017-11-22 NOTE — Therapy (Signed)
[place], [place], [address] Phone: [phone]   Fax:  [phone]  Modified Barium Swallow  Patient Details  Name: George Norris MRN: [id number] Date of Birth: [birth year] Referring Provider: Eppie Gibson, MD   Encounter Date: 11/22/2017  End of Session - 11/22/17 1643    Visit Number  1    Number of Visits  1    Authorization Type  [employer]BS $25 copay    SLP Start Time  1330    SLP Stop Time   1414    SLP Time Calculation (min)  44 min    Activity Tolerance  Patient tolerated treatment well       Past Medical History:  Diagnosis Date  . Erectile dysfunction   . History of hand surgery 2008   right hand tendon surgery  . History of radiation therapy 12/11/16- 01/23/17   hard palate treated to 60 Gy in 30 fractions.   . Hypercholesteremia     Past Surgical History:  Procedure Laterality Date  . history of hand surgery Right 2008   history of hand/ tendon surgery  . left neck dissection  10/24/2016  . MAXILLECTOMY Left 10/24/2016   Left infrastructure maxillectomy, placement of maxillary prosthesis    There were no vitals filed for this visit.  Subjective Assessment - 11/22/17 1436    Subjective  "Sometimes liquids come out of my nose."    Patient is accompained by:  Family member    Special Tests  MBSS    Currently in Pain?  No/denies        General - 11/22/17 1437      General Information   Date of Onset  12/11/16    HPI  George Norris is a 52 yo male who was referred for MBSS by Dr. Eppie Gibson. Pt underwent left infrastructure maxillectomy in May 2018 and was placed with a maxillary prosthesis. His hard palate was treated to 60 Gy in 30 fractions from 12/11/2016 to 01/23/2017. Pt denies difficulty swallowing, however does endorse occasional nasal reflux of liquids.   Type of Study  MBS-Modified Barium Swallow Study    Previous Swallow Assessment  None on record    Diet Prior to this Study   Regular;Thin liquids    Temperature Spikes Noted  No    Respiratory Status  Room air    History of Recent Intubation  No    Behavior/Cognition  Alert;Cooperative;Pleasant mood    Oral Cavity Assessment  Within Functional Limits;Other (comment) prosthesis on left palate    Oral Care Completed by SLP  No    Oral Cavity - Dentition  Other (Comment) has left palatal prosthesis    Vision  Functional for self feeding    Self-Feeding Abilities  Able to feed self    Patient Positioning  Upright in chair    Baseline Vocal Quality  Normal    Volitional Cough  Strong    Volitional Swallow  Able to elicit    Anatomy  Within functional limits    Pharyngeal Secretions  Not observed secondary MBS         Oral Preparation/Oral Phase - 11/22/17 1446      Oral Preparation/Oral Phase   Oral Phase  Impaired      Oral - Thin   Oral - Thin Cup  Other (Comment);Oral residue    Oral - Thin Straw  Other (Comment);Oral residue      Oral - Solids   Oral -  Puree  Delayed A-P transit;Oral residue;Other (Comment)    Oral - Regular  Delayed A-P transit;Oral residue;Other (Comment)      Electrical stimulation - Oral Phase   Was Electrical Stimulation Used  No       Pharyngeal Phase - 11/22/17 1641      Pharyngeal Phase   Pharyngeal Phase  Impaired      Pharyngeal - Thin   Pharyngeal- Thin Cup  Delayed swallow initiation;Swallow initiation at vallecula;Penetration/Aspiration during swallow    Pharyngeal  Material does not enter airway;Material enters airway, remains ABOVE vocal cords then ejected out    Pharyngeal- Thin Straw  Delayed swallow initiation;Swallow initiation at vallecula;Penetration/Aspiration during swallow    Pharyngeal  Material does not enter airway;Material enters airway, remains ABOVE vocal cords then ejected out      Pharyngeal - Solids   Pharyngeal- Puree  Within functional limits    Pharyngeal- Regular  Within functional limits    Pharyngeal- Pill  Within functional limits        Cricopharyngeal Phase - 11/22/17 1642      Cervical Esophageal Phase   Cervical Esophageal Phase  Within functional limits        Plan - 11/22/17 1644    Clinical Impression Statement  Pt presents with mild/mod oral phase dysphagia and mild pharyngeal phase dysphagia characterized by prolonged bolus preparation with solids with reduced lingual A-P transit, delay in swallow initiation with swallow trigger after spilling to the valleculae with cup sips of thin and spilling to the pyriforms with straw sips (required nasal occlusion) and when taking cup sips thin with barium tablet resulting in variable trace penetration during the swallow with thins and no aspiration or significant pharyngeal residuals. Of note-Pt appeared to have a small gap between palatal prosthesis and uvula, which allowed reflux/backflow of bolus (liquids and puree) from oral cavity into nasopharynx which then spilled over the top of the uvulae and dripped into pharynx. Pt with a more significant amount when presented with purees. Pt was unable to suck from the straw until SLP occluded nose.  Recommend Pt consume self regulated regular textures and thin liquids via cup sip and have prosthesis adjusted to ensure closure into nasopharynx. Pt also noted to have significant trismus and may benefit from trismus therapy once cleared by MD. The results and imaging were reviewed with Pt and his fiancee.    Consulted and Agree with Plan of Care  Patient       Patient will benefit from skilled therapeutic intervention in order to improve the following deficits and impairments:   Dysphagia, oral phase    Recommendations/Treatment - 11/22/17 1642      Swallow Evaluation Recommendations   Recommended Consults  Other (Comment) have palatal prosthesis adjusted    SLP Diet Recommendations  Thin;Age appropriate regular    Liquid Administration via  Cup    Medication Administration  Whole meds with liquid    Supervision  Patient  able to self feed    Postural Changes  Seated upright at 90 degrees;Remain upright for at least 30 minutes after feeds/meals       Prognosis - 11/22/17 1643      Prognosis   Prognosis for Safe Diet Advancement  Good      Individuals Consulted   Consulted and Agree with Results and Recommendations  Patient;Family member/caregiver       Problem List Patient Active Problem List   Diagnosis Date Noted  . 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

## 2017-11-26 ENCOUNTER — Telehealth: Payer: Self-pay | Admitting: *Deleted

## 2017-11-26 NOTE — Telephone Encounter (Signed)
Oncology Nurse Navigator Documentation  In follow-up to requested MBS, faxed copy of 6/13 report to [facility], [place]. Dentistry [phone]).  Notification of successful fax transmission received.  Gayleen Orem, RN, BSN Head & Neck Oncology Nurse [place] at [place] [phone]

## 2019-02-25 IMAGING — CT CT OUTSIDE FILMS CHEST
2 of 5 series · 16 of 36 positions shown, 20 images · IV contrast (OMNIPAQUE)
Comparison: none

[Series 4: thins · axial · 0.68mm/px · z∈[-318,-31]mm · 13 of 260 slices shown, 17 images]
[im 15/260  mediastinal]
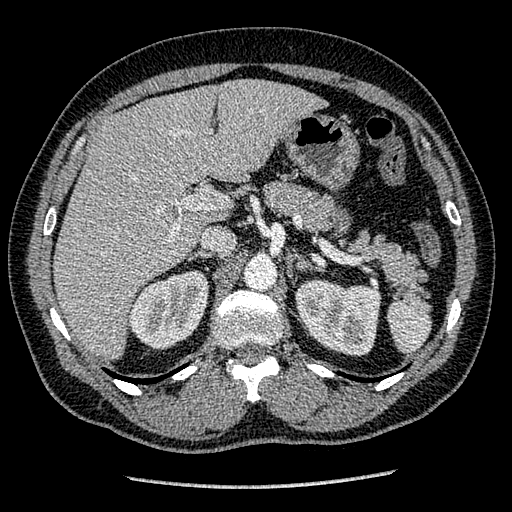
[im 15/260  lung]
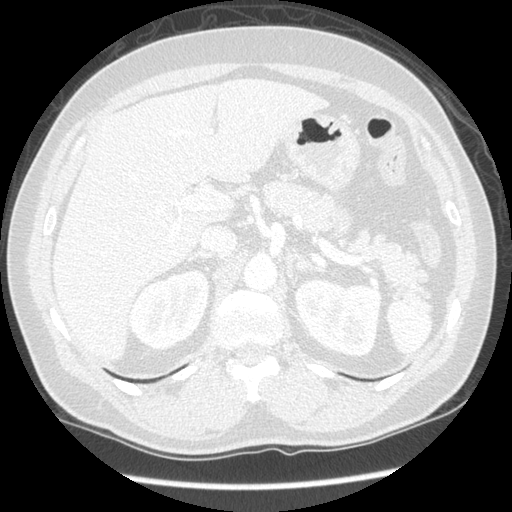
[im 29/260  lung]
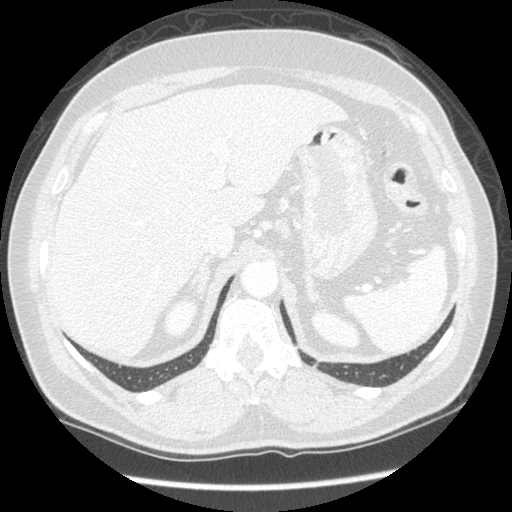
[im 58/260  lung]
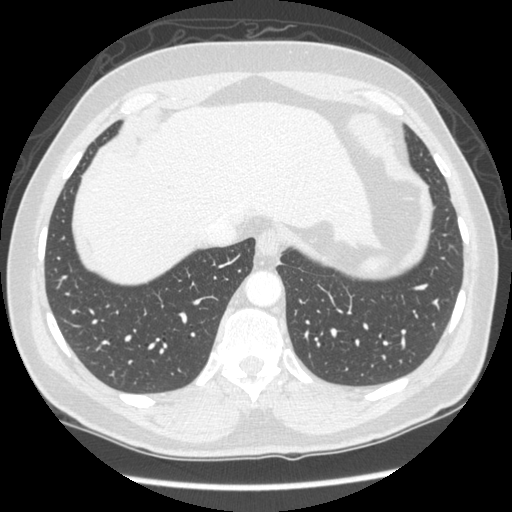
[im 72/260  lung]
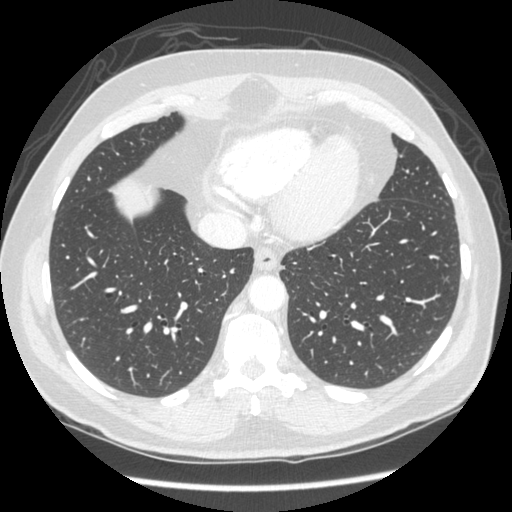
[im 87/260  mediastinal]
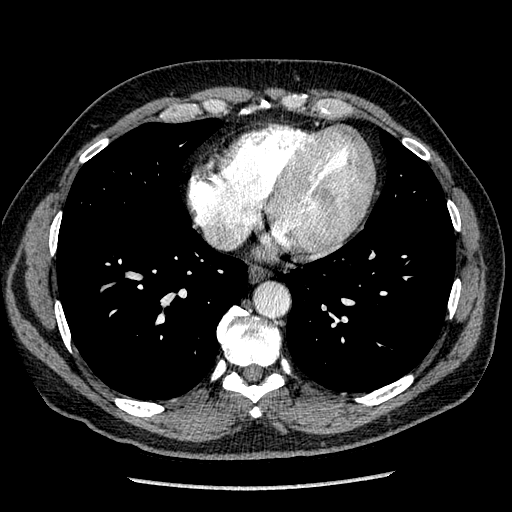
[im 87/260  lung]
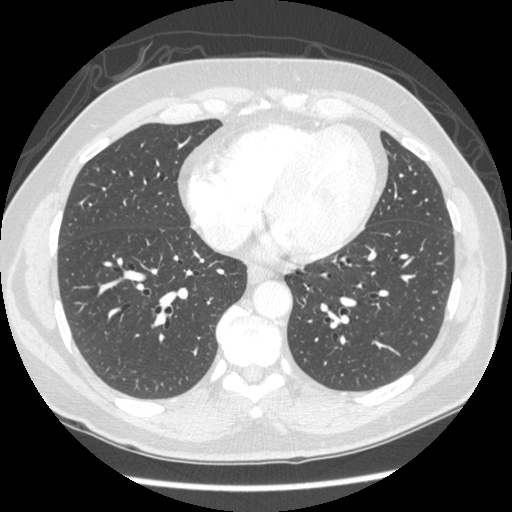
[im 116/260  lung]
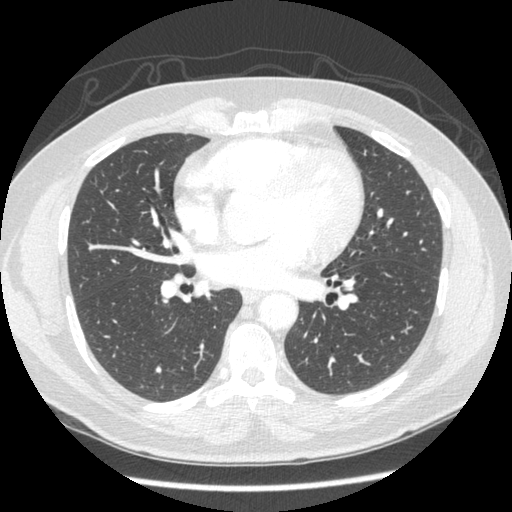
[im 130/260  lung]
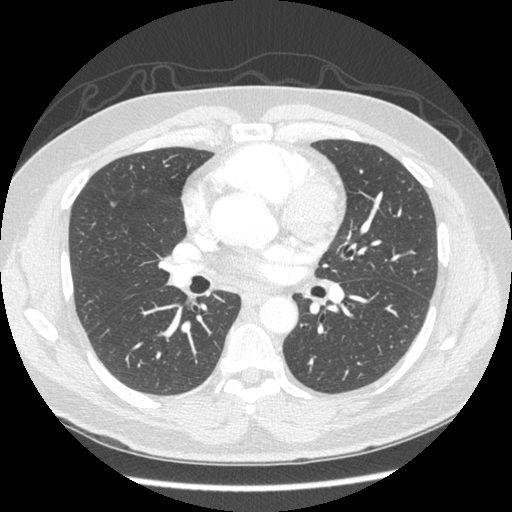
[im 144/260  lung]
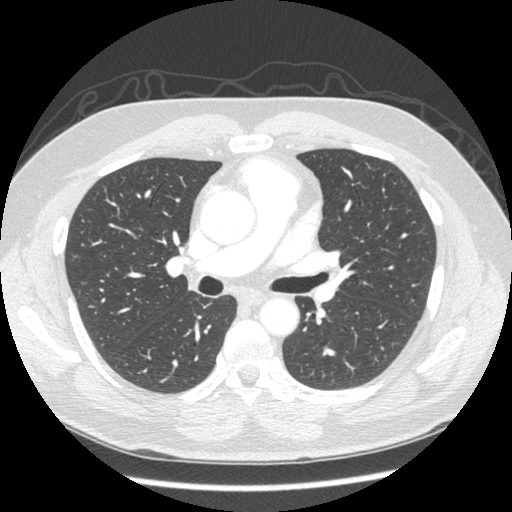
[im 173/260  mediastinal]
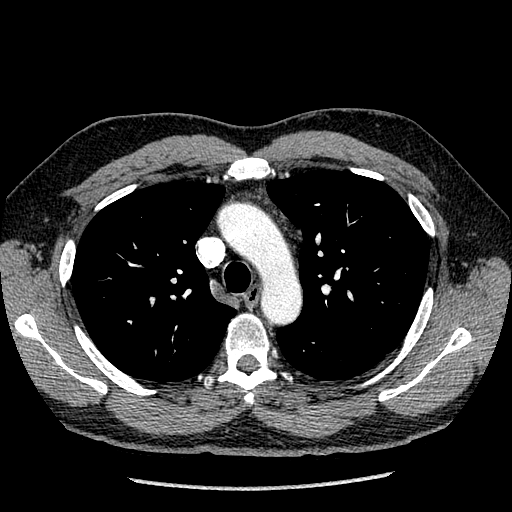
[im 173/260  lung]
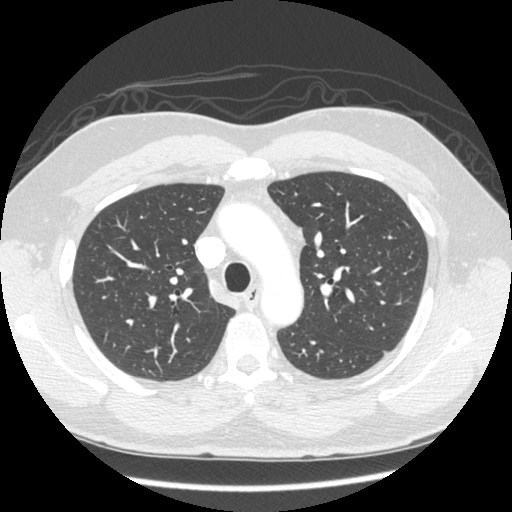
[im 188/260  lung]
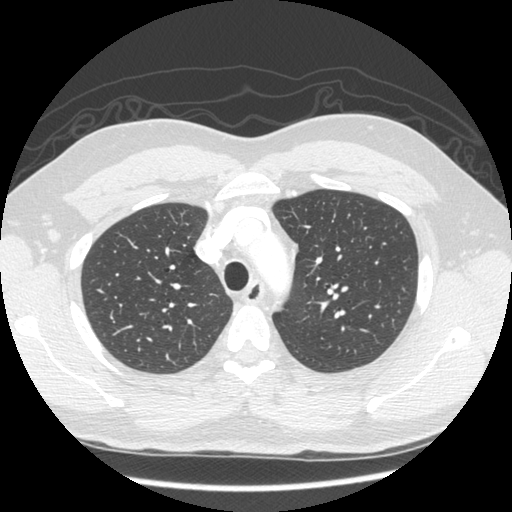
[im 202/260  lung]
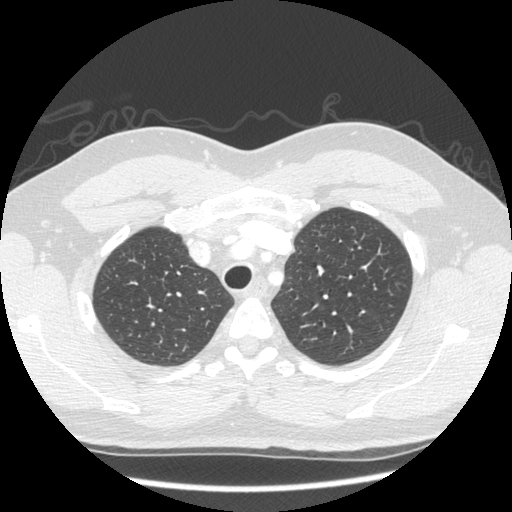
[im 231/260  lung]
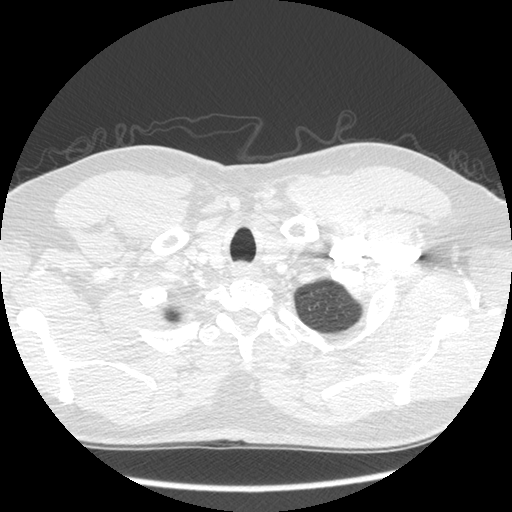
[im 245/260  mediastinal]
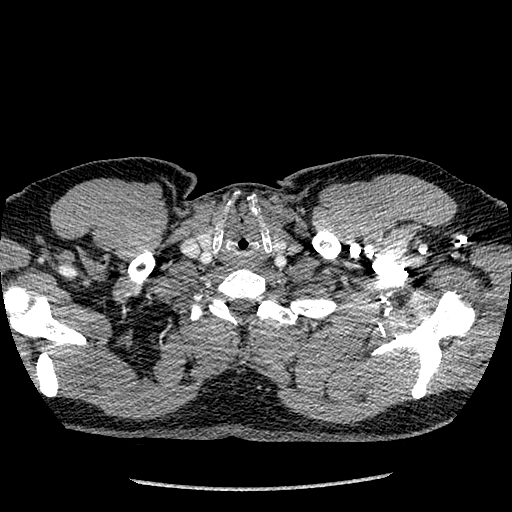
[im 245/260  lung]
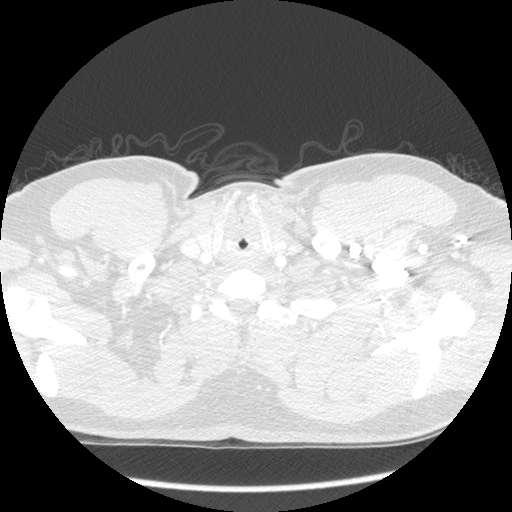

[Series 602: sag chest 3mm · sagittal · 0.68mm/px · 3 of 117 slices shown]
[im 24/117  lung]
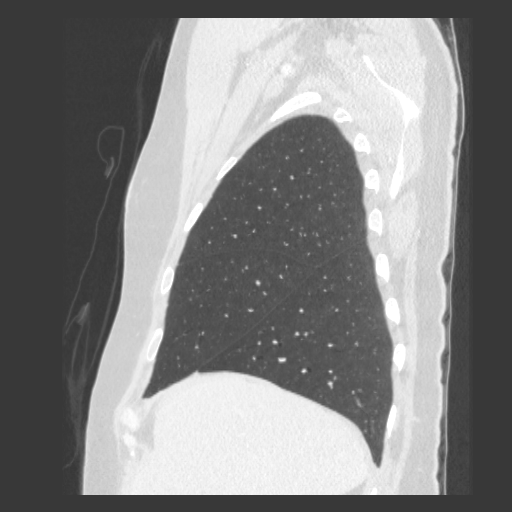
[im 47/117  lung]
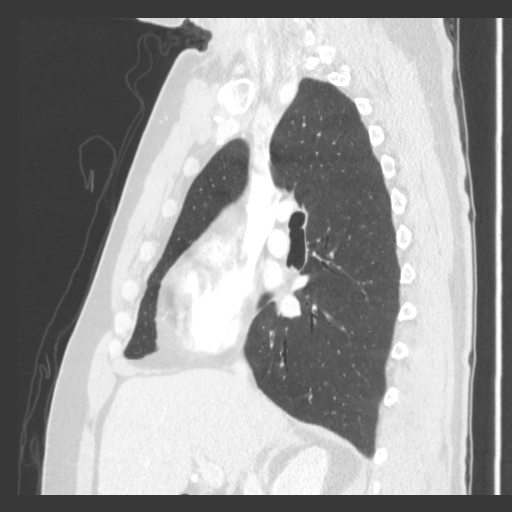
[im 70/117  lung]
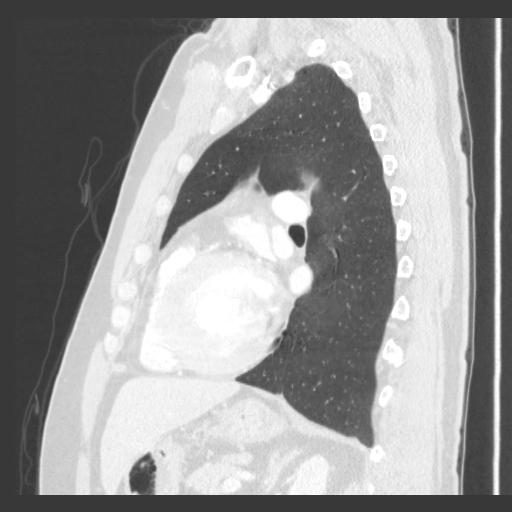

[16 of 36 positions shown; findings below may reference images not displayed]

Canned report from images found in remote index.

Refer to host system for actual result text.

## 2020-04-29 ENCOUNTER — Other Ambulatory Visit (HOSPITAL_COMMUNITY): Payer: Self-pay | Admitting: Specialist

## 2020-04-29 DIAGNOSIS — C069 Malignant neoplasm of mouth, unspecified: Secondary | ICD-10-CM

## 2020-05-03 ENCOUNTER — Other Ambulatory Visit: Payer: Self-pay

## 2020-05-03 ENCOUNTER — Ambulatory Visit (HOSPITAL_COMMUNITY)
Admission: RE | Admit: 2020-05-03 | Discharge: 2020-05-03 | Disposition: A | Discharge status: Home / Self Care | Payer: BC Managed Care – PPO | Source: Ambulatory Visit | Attending: Oral Surgery | Admitting: Oral Surgery

## 2020-05-03 ENCOUNTER — Encounter (HOSPITAL_COMMUNITY): Payer: Self-pay | Admitting: Speech Pathology

## 2020-05-03 ENCOUNTER — Ambulatory Visit (HOSPITAL_COMMUNITY): Payer: BC Managed Care – PPO | Attending: Oral Surgery | Admitting: Speech Pathology

## 2020-05-03 DIAGNOSIS — R1312 Dysphagia, oropharyngeal phase: Secondary | ICD-10-CM | POA: Diagnosis not present

## 2020-05-03 DIAGNOSIS — C069 Malignant neoplasm of mouth, unspecified: Secondary | ICD-10-CM | POA: Insufficient documentation

## 2020-05-03 NOTE — Therapy (Signed)
[place], [place], [address] Phone: [phone]   Fax:  [phone]  Modified Barium Swallow  Patient Details  Name: George Norris MRN: [id number] Date of Birth: 10/21/1965 No data recorded  Encounter Date: 05/03/2020   End of Session - 05/03/20 1414    Visit Number 1    Number of Visits 1    Authorization Type [employer] PPO    SLP Start Time 7824    SLP Stop Time  1210    SLP Time Calculation (min) 34 min    Activity Tolerance Patient tolerated treatment well           Past Medical History:  Diagnosis Date  . Erectile dysfunction   . History of hand surgery 2008   right hand tendon surgery  . History of radiation therapy 12/11/16- 01/23/17   hard palate treated to 60 Gy in 30 fractions.   . Hypercholesteremia     Past Surgical History:  Procedure Laterality Date  . history of hand surgery Right 2008   history of hand/ tendon surgery  . left neck dissection  10/24/2016  . MAXILLECTOMY Left 10/24/2016   Left infrastructure maxillectomy, placement of maxillary prosthesis    There were no vitals filed for this visit.   Subjective Assessment - 05/03/20 1400    Subjective "I still have liquid go up through my nose."    Patient is accompained by: Family member    Special Tests MBSS    Currently in Pain? No/denies           MBSS from 11/2017:  <<Pt presents with mild/mod oral phase dysphagia and mild pharyngeal phase dysphagia characterized by prolonged bolus preparation with solids with reduced lingual A-P transit, delay in swallow initiation with swallow trigger after spilling to the valleculae with cup sips of thin and spilling to the pyriforms with straw sips (required nasal occlusion) and when taking cup sips thin with barium tablet resulting in variable trace penetration during the swallow with thins and no aspiration or significant pharyngeal residuals. Of note-Pt appeared to have a small gap  between palatal prosthesis and uvula, which allowed reflux/backflow of bolus (liquids and puree) from oral cavity into nasopharynx which then spilled over the top of the uvulae and dripped into pharynx. Pt with a more significant amount when presented with purees. Pt was unable to suck from the straw until SLP occluded nose.  Recommend Pt consume self regulated regular textures and thin liquids via cup sip and have prosthesis adjusted to ensure closure into nasopharynx. Pt also noted to have significant trismus and may benefit from trismus therapy once cleared by MD. The results and imaging were reviewed with Pt and his fiancee.>>    General - 05/03/20 1402      General Information   Date of Onset 10/10/16    Type of Study MBS-Modified Barium Swallow Study    Previous Swallow Assessment MBSS 11/22/2017 Regular thin    Diet Prior to this Study Regular;Thin liquids    Temperature Spikes Noted No    Respiratory Status Room air    History of Recent Intubation No    Behavior/Cognition Alert;Cooperative    Oral Cavity Assessment Within Functional Limits    Oral Care Completed by SLP No    Oral Cavity - Dentition Other (Comment)   upper oral prosthesis   Vision Functional for self feeding    Self-Feeding Abilities Able to feed self    Patient Positioning Upright  in chair    Baseline Vocal Quality Normal    Volitional Cough Strong    Volitional Swallow Able to elicit    Anatomy Within functional limits   maxillectomy, now with prosthesis   Pharyngeal Secretions Not observed secondary MBS              Oral Preparation/Oral Phase - 05/03/20 1408      Oral Preparation/Oral Phase   Oral Phase Impaired      Oral - Thin   Oral - Thin Cup Oral residue      Electrical stimulation - Oral Phase   Was Electrical Stimulation Used No            Pharyngeal Phase - 05/03/20 1408      Pharyngeal Phase   Pharyngeal Phase Impaired      Pharyngeal - Thin   Pharyngeal- Thin Cup Swallow  initiation at vallecula;Penetration/Aspiration during swallow;Reduced tongue base retraction;Pharyngeal residue - valleculae    Pharyngeal Material enters airway, remains ABOVE vocal cords then ejected out;Material does not enter airway      Pharyngeal - Solids   Pharyngeal- Puree Within functional limits    Pharyngeal- Regular Within functional limits    Pharyngeal- Pill Within functional limits   flash penetration of thins during the swallow with pill     Pharyngeal Phase - Comment   Pharyngeal Comment some leakage near soft palate- approximation of prosthesis and soft palate      Electrical Stimulation - Pharyngeal Phase   Was Electrical Stimulation Used No            Cricopharyngeal Phase - 05/03/20 1412      Cervical Esophageal Phase   Cervical Esophageal Phase Within functional limits   brief stasis of pill in upper esophagus, cleared with liquid wash             Plan - 05/03/20 1415    Clinical Impression Statement MBSS completed with prosthesis in place (also removed). Pt presents with mild oropharyngeal dysphagia similar to MBSS from 11/2017, however now with a different prosthesis. Pt with reduced bolus cohesiveness with liquids, resulting in pooling in lateral sulci with eventual clearance. Swallow trigger was generally at the level of the valleculae, occasional trace, flash penetration of thins during the swallow with sequential sips and when taking barium tablet with thin. Of note, barium leaked between the posterior edge of the prosthesis and the soft palate. This spot was identified by placing placing a straw with a spot of barium on it at the site and observed. Pt was seen with and without the prosthesis. The leakage appears minimal compared to the MBSS from 2019, however continues to be bothersome for the Pt and he experiences liquids in his nasal cavity frequently (he occludes with a paper towel and/or tips his head back). Tipping his head back did seem to prevent the  liquid from moving into anterior nasal cavity and moved over the soft palate back down into pharynx. It appears that Pt's prosthesis needs an adaptation near the posterior portion to extend further back over the soft palate (see imaging). Pt was reportedly ordered a Therabite for his trismus, but he has not yet received. SLP will contact that SLP Donzetta Starch) to see if the device has been ordered. He was given an ARK-J device to use in the interim. Please feel free to contact me if questions. Pt cleared for regular textures and thin liquids.    Potential to Achieve Goals Good  Consulted and Agree with Plan of Care Patient           Patient will benefit from skilled therapeutic intervention in order to improve the following deficits and impairments:   Dysphagia, oropharyngeal phase     Recommendations/Treatment - 05/03/20 1412      Swallow Evaluation Recommendations   Recommended Consults --   follow up with prosthedontist   SLP Diet Recommendations Thin;Age appropriate regular    Liquid Administration via Cup    Medication Administration Whole meds with liquid    Supervision Patient able to self feed    Postural Changes Seated upright at 90 degrees            Prognosis - 05/03/20 1414      Prognosis   Prognosis for Safe Diet Advancement Good      Individuals Consulted   Consulted and Agree with Results and Recommendations Patient    Report Sent to  Referring physician           Problem List Patient Active Problem List   Diagnosis Date Noted  . 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 [place], [place], [address] Phone: [phone]   Fax:  [phone]  Name: George Norris MRN: [id number] Date of Birth: [birth year]

## 2022-08-03 ENCOUNTER — Encounter: Payer: Self-pay | Admitting: *Deleted

## 2022-09-11 ENCOUNTER — Ambulatory Visit (INDEPENDENT_AMBULATORY_CARE_PROVIDER_SITE_OTHER): Payer: Commercial Managed Care - PPO | Admitting: Urology

## 2022-09-11 ENCOUNTER — Encounter: Payer: Self-pay | Admitting: Urology

## 2022-09-11 VITALS — BP 142/87 | HR 82

## 2022-09-11 DIAGNOSIS — N401 Enlarged prostate with lower urinary tract symptoms: Secondary | ICD-10-CM | POA: Diagnosis not present

## 2022-09-11 DIAGNOSIS — R972 Elevated prostate specific antigen [PSA]: Secondary | ICD-10-CM | POA: Diagnosis not present

## 2022-09-11 DIAGNOSIS — N5201 Erectile dysfunction due to arterial insufficiency: Secondary | ICD-10-CM | POA: Diagnosis not present

## 2022-09-11 DIAGNOSIS — R3912 Poor urinary stream: Secondary | ICD-10-CM

## 2022-09-11 MED ORDER — SILDENAFIL CITRATE 20 MG PO TABS: 20.0000 mg | ORAL_TABLET | Freq: Every day | ORAL | 11 refills | Status: AC

## 2022-09-11 NOTE — Progress Notes (Unsigned)
09/11/2022 2:57 PM   [place] 07/17/1965 [id number]  Referring provider: Redmond School, MD [address] [place],  [place] [address]  No chief complaint on file.   HPI:  New pt -   1) PSA elevation-February 2024 PSA of 4.1.  Creatinine 0.95. No prior PSA elevation. No FH PCa.  2) BPH - No prior h/o BPH. AUASS = 13. Sometimes stream weak.   3) ED - has trouble getting and maintaining erection. Took Viagra and it worked.   Today. Seen for the above.   He is not on oral anticoagulants. He does asphalt work.    PMH: Past Medical History:  Diagnosis Date   Erectile dysfunction    History of hand surgery 2008   right hand tendon surgery   History of radiation therapy 12/11/16- 01/23/17   hard palate treated to 60 Gy in 30 fractions.    Hypercholesteremia     Surgical History: Past Surgical History:  Procedure Laterality Date   history of hand surgery Right 2008   history of hand/ tendon surgery   left neck dissection  10/24/2016   MAXILLECTOMY Left 10/24/2016   Left infrastructure maxillectomy, placement of maxillary prosthesis    Home Medications:  Allergies as of 09/11/2022   No Known Allergies      Medication List        Accurate as of September 11, 2022  2:57 PM. If you have any questions, ask your nurse or doctor.          HYDROcodone-acetaminophen 7.5-325 mg/15 ml solution Commonly known as: HYCET Take 10 mLs by mouth 4 (four) times daily as needed for moderate pain.   lidocaine 2 % solution Commonly known as: XYLOCAINE Patient: Mix 1part 2% viscous lidocaine, 1part H20. Swish & swallow 88mL of diluted mixture, 74min before meals and at bedtime, up to QID   LORazepam 0.5 MG tablet Commonly known as: ATIVAN Place 1 tablet (0.5 mg total) under the tongue every 8 (eight) hours as needed. nausea   sodium chloride 0.65 % nasal spray Commonly known as: OCEAN Place into the nose.   sodium fluoride 1.1 % Crea dental cream Commonly known as:  PreviDent 5000 Plus Apply to tooth brush. Brush teeth for 2 minutes. Spit out excess. Repeat nightly.        Allergies: No Known Allergies  Family History: Family History  Problem Relation Age of Onset   Hypertension Brother     Social History:  reports that he quit smoking about 5 years ago. He has a 25.00 pack-year smoking history. He has never used smokeless tobacco. He reports current alcohol use. He reports that he does not use drugs.   Physical Exam: BP (!) 142/87   Pulse 82   Constitutional:  Alert and oriented, No acute distress. HEENT: [place] AT, moist mucus membranes.  Trachea midline, no masses. Cardiovascular: No clubbing, cyanosis, or edema. Respiratory: Normal respiratory effort, no increased work of breathing. GI: Abdomen is soft, nontender, nondistended, no abdominal masses GU: No CVA tenderness Lymph: No cervical or inguinal lymphadenopathy. Skin: No rashes, bruises or suspicious lesions. Neurologic: Grossly intact, no focal deficits, moving all 4 extremities. Psychiatric: Normal mood and affect. DRE: prostate 30 g and smooth - no hard area or nodule   Laboratory Data: Lab Results  Component Value Date   WBC 4.8 01/22/2017   HGB 13.7 01/22/2017   HCT 41.1 01/22/2017   MCV 82.4 01/22/2017   PLT 229 01/22/2017    Lab Results  Component  Value Date   CREATININE 1.0 01/22/2017    No results found for: "PSA"  No results found for: "TESTOSTERONE"  No results found for: "HGBA1C"  Urinalysis No results found for: "COLORURINE", "APPEARANCEUR", "LABSPEC", "PHURINE", "GLUCOSEU", "HGBUR", "BILIRUBINUR", "KETONESUR", "PROTEINUR", "UROBILINOGEN", "NITRITE", "LEUKOCYTESUR"  No results found for: "LABMICR", "WBCUA", "RBCUA", "LABEPIT", "MUCUS", "BACTERIA"    Assessment & Plan:   BPH - disc surv, meds and procedures. Cont surv.  ED _I sent rx for sildenafil. Disc nature r/b/a to sildenafil  PSA - I had a long discussion with the patient on the nature of  elevated PSA - benign vs malignant causes. We discussed age specific levels and that PCa can be seen on a biopsy with very low PSA levels (<=2.5). We discussed the nature risks and benefits of continued surveillance, other lab tests, imaging as well as prostate biopsy. We discussed the management of prostate cancer might include active surveillance or treatment depending on biopsy findings. Exam benign. PSA sent. Consider MRI. All questions answered.  He had sexual activity last night - disc recheck PSA in 2 weeks with 2-3 days abstinence.    No follow-ups on file.  Festus Aloe, MD  [facility]  [address] [place], [place] [address] [phone]

## 2022-09-12 LAB — MICROSCOPIC EXAMINATION: Bacteria, UA: NONE SEEN

## 2022-09-12 LAB — URINALYSIS, ROUTINE W REFLEX MICROSCOPIC
Bilirubin, UA: NEGATIVE
Glucose, UA: NEGATIVE
Ketones, UA: NEGATIVE
Leukocytes,UA: NEGATIVE
Nitrite, UA: NEGATIVE
RBC, UA: NEGATIVE
Specific Gravity, UA: 1.02 (ref 1.005–1.030)
Urobilinogen, Ur: 2 mg/dL — ABNORMAL HIGH (ref 0.2–1.0)
pH, UA: 8.5 — ABNORMAL HIGH (ref 5.0–7.5)

## 2022-09-18 ENCOUNTER — Telehealth: Payer: Self-pay

## 2022-09-18 NOTE — Telephone Encounter (Signed)
Pharmacy call to get clarification on the direction of how the patient is to take sildenafil 20 mg filled on 04/01. Pharmacy is aware that a task will be sent to MD for clarification. Pharmacy voiced understanding.

## 2022-09-19 NOTE — Telephone Encounter (Signed)
Jessica pharmacist is aware of MD instruction and Shanda Bumps voiced understanding.

## 2022-09-19 NOTE — Telephone Encounter (Signed)
Let pharmacy know:  Take 1-5 tablets as needed prior to sexual activity. Do not take 20 mg daily. Thanks

## 2022-09-25 ENCOUNTER — Other Ambulatory Visit: Payer: BC Managed Care – PPO

## 2022-09-26 LAB — PSA: Prostate Specific Ag, Serum: 3.1 ng/mL (ref 0.0–4.0)

## 2022-10-02 ENCOUNTER — Telehealth: Payer: Self-pay

## 2022-10-02 DIAGNOSIS — R972 Elevated prostate specific antigen [PSA]: Secondary | ICD-10-CM

## 2022-10-02 NOTE — Telephone Encounter (Signed)
-----   Message from Jerilee Field, MD sent at 09/27/2022  1:11 PM EDT ----- Let Casimiro Needle know his PSA looks good -normal. He does not need to see me in 3 months but should see me in 6 months with a PSA prior. Thank you!  ----- Message ----- From: Troy Sine, CMA Sent: 09/26/2022   9:32 AM EDT To: Jerilee Field, MD  Please review

## 2022-10-02 NOTE — Telephone Encounter (Signed)
Tried calling patient and patient was unavailable because he was at work. Spoke with patient's wife Ramona making her aware that patient PSA looks good per Dr. Mena Goes and he need a PSA prior to follow up office visit in 6 month. Ramona voiced understanding

## 2022-12-11 ENCOUNTER — Ambulatory Visit: Payer: BC Managed Care – PPO | Admitting: Urology

## 2023-01-16 ENCOUNTER — Encounter: Payer: Self-pay | Admitting: *Deleted

## 2023-03-26 ENCOUNTER — Other Ambulatory Visit: Payer: Commercial Managed Care - PPO

## 2023-04-02 ENCOUNTER — Ambulatory Visit: Payer: Commercial Managed Care - PPO | Admitting: Urology

## 2023-04-02 DIAGNOSIS — R972 Elevated prostate specific antigen [PSA]: Secondary | ICD-10-CM

## 2023-04-05 ENCOUNTER — Telehealth: Payer: Self-pay | Admitting: Urology

## 2023-04-05 ENCOUNTER — Other Ambulatory Visit: Payer: Self-pay

## 2023-04-05 DIAGNOSIS — R972 Elevated prostate specific antigen [PSA]: Secondary | ICD-10-CM

## 2023-04-05 NOTE — Telephone Encounter (Signed)
Please send psa order over to [facility] next door they are going to come tomorrow to have his lab work done

## 2023-04-05 NOTE — Telephone Encounter (Signed)
Orders given to [facility], per patient request.

## 2023-04-16 ENCOUNTER — Ambulatory Visit: Payer: Commercial Managed Care - PPO | Admitting: Urology

## 2023-10-22 ENCOUNTER — Telehealth: Payer: Self-pay

## 2023-10-22 NOTE — Telephone Encounter (Signed)
 Called pt to confirm Rx request from [facility] pharmacy pt did not answer and vm box is full
